# Patient Record
Sex: Female | Born: 1975 | Race: Black or African American | Hispanic: No | Marital: Single | Smoking: Former smoker
Health system: Southern US, Community
[De-identification: ages and names within clinical notes are randomized; demographics above are authoritative.]

## PROBLEM LIST (undated history)

## (undated) ENCOUNTER — Ambulatory Visit: Admission: EM | Payer: Medicaid Other | Source: Home / Self Care

## (undated) DIAGNOSIS — D497 Neoplasm of unspecified behavior of endocrine glands and other parts of nervous system: Secondary | ICD-10-CM

## (undated) HISTORY — PX: OTHER SURGICAL HISTORY: SHX169

## (undated) HISTORY — PX: ABDOMINAL HYSTERECTOMY: SHX81

---

## 2014-04-17 ENCOUNTER — Emergency Department (HOSPITAL_COMMUNITY)
Admission: EM | Admit: 2014-04-17 | Discharge: 2014-04-17 | Disposition: A | Discharge status: Home / Self Care | Payer: Medicaid Other | Attending: Emergency Medicine | Admitting: Emergency Medicine

## 2014-04-17 ENCOUNTER — Encounter (HOSPITAL_COMMUNITY): Payer: Self-pay | Admitting: Emergency Medicine

## 2014-04-17 DIAGNOSIS — Z880 Allergy status to penicillin: Secondary | ICD-10-CM | POA: Insufficient documentation

## 2014-04-17 DIAGNOSIS — K089 Disorder of teeth and supporting structures, unspecified: Secondary | ICD-10-CM | POA: Insufficient documentation

## 2014-04-17 DIAGNOSIS — K0889 Other specified disorders of teeth and supporting structures: Secondary | ICD-10-CM

## 2014-04-17 DIAGNOSIS — K029 Dental caries, unspecified: Secondary | ICD-10-CM | POA: Insufficient documentation

## 2014-04-17 DIAGNOSIS — Z79899 Other long term (current) drug therapy: Secondary | ICD-10-CM | POA: Insufficient documentation

## 2014-04-17 DIAGNOSIS — Z792 Long term (current) use of antibiotics: Secondary | ICD-10-CM | POA: Insufficient documentation

## 2014-04-17 MED ORDER — CLINDAMYCIN HCL 150 MG PO CAPS: 300.0000 mg | ORAL_CAPSULE | Freq: Three times a day (TID) | ORAL | Status: DC

## 2014-04-17 MED ORDER — LIDOCAINE VISCOUS 2 % MT SOLN: 20.0000 mL | OROMUCOSAL | Status: DC | PRN

## 2014-04-17 MED ORDER — ACIDOPHILUS PROBIOTIC 10 MG PO TABS: 1.0000 | ORAL_TABLET | Freq: Every day | ORAL | Status: DC

## 2014-04-17 NOTE — Discharge Instructions (Signed)
Please follow up with your primary care physician in 1-2 days. If you do not have one please call the [facility] number listed above. Please follow up with your dentist to discuss your visit to the ER as soon as possible. Please take your antibiotic until completion. Please alternate between Motrin and Tylenol every three hours for fevers and pain.  Please read all discharge instructions and return precautions.    Dental Pain A tooth ache may be caused by cavities (tooth decay). Cavities expose the nerve of the tooth to air and hot or cold temperatures. It may come from an infection or abscess (also called a boil or furuncle) around your tooth. It is also often caused by dental caries (tooth decay). This causes the pain you are having. DIAGNOSIS  Your caregiver can diagnose this problem by exam. TREATMENT   If caused by an infection, it may be treated with medications which kill germs (antibiotics) and pain medications as prescribed by your caregiver. Take medications as directed.  Only take over-the-counter or prescription medicines for pain, discomfort, or fever as directed by your caregiver.  Whether the tooth ache today is caused by infection or dental disease, you should see your dentist as soon as possible for further care. SEEK MEDICAL CARE IF: The exam and treatment you received today has been provided on an emergency basis only. This is not a substitute for complete medical or dental care. If your problem worsens or new problems (symptoms) appear, and you are unable to meet with your dentist, call or return to this location. SEEK IMMEDIATE MEDICAL CARE IF:   You have a fever.  You develop redness and swelling of your face, jaw, or neck.  You are unable to open your mouth.  You have severe pain uncontrolled by pain medicine. MAKE SURE YOU:   Understand these instructions.  Will watch your condition.  Will get help right away if you are not doing well or get  worse. Document Released: 11/24/2005 Document Revised: 02/16/2012 Document Reviewed: 07/12/2008 [facility] Patient Information 2014 [place].

## 2014-04-17 NOTE — ED Provider Notes (Signed)
Medical screening examination/treatment/procedure(s) were performed by non-physician practitioner and as supervising physician I was immediately available for consultation/collaboration.   EKG Interpretation None        Ezequiel Essex, MD 04/17/14 1736

## 2014-04-17 NOTE — ED Provider Notes (Signed)
CSN: [id number]     Arrival date & time 04/17/14  1256 History   First MD Initiated Contact with Patient 04/17/14 1306     Chief Complaint  Patient presents with  . Dental Pain     (Consider location/radiation/quality/duration/timing/severity/associated sxs/prior Treatment) HPI Comments: Patient is a 38 year old female presenting to the emergency department for mild upper gum discomfort with mild swelling x1 day. Patient feels like the whole right side of her face swelling. She states she has had something similar to this in the past and required antibiotics for a dental infection. Alleviating factors: none. Aggravating factors: none. No medications tried prior to arrival. Patient denies any fevers, chills, difficulty breathing, difficulty swallowing, sore throat, nasal congestion, rhinorrhea, chest pain, shortness of breath, nausea, vomiting. Patient does have a dentist.   Patient is a 38 y.o. female presenting with tooth pain.  Dental Pain Associated symptoms: facial swelling   Associated symptoms: no drooling and no fever     History reviewed. No pertinent past medical history. History reviewed. No pertinent past surgical history. No family history on file. History  Substance Use Topics  . Smoking status: Never Smoker   . Smokeless tobacco: Not on file  . Alcohol Use: Yes   OB History   Grav Para Term Preterm Abortions TAB SAB Ect Mult Living                 Review of Systems  Constitutional: Negative for fever and chills.  HENT: Positive for dental problem and facial swelling. Negative for drooling, ear pain, rhinorrhea, sinus pressure, sore throat, tinnitus and voice change.   Respiratory: Negative for shortness of breath.   Cardiovascular: Negative for chest pain.  All other systems reviewed and are negative.     Allergies  Penicillins  Home Medications   Prior to Admission medications   Medication Sig Start Date End Date Taking? Authorizing Provider   clindamycin (CLEOCIN) 150 MG capsule Take 2 capsules (300 mg total) by mouth 3 (three) times daily. May dispense as 150mg  capsules 04/17/14   Zyanne Schumm L Ziyon Cedotal, PA-C  Lactobacillus (ACIDOPHILUS PROBIOTIC) 10 MG TABS Take 1 tablet by mouth daily. 04/17/14   Macsen Nuttall L Derrika Ruffalo, PA-C  lidocaine (XYLOCAINE) 2 % solution Use as directed 20 mLs in the mouth or throat as needed for mouth pain. 04/17/14   Rasheida Broden L Briley Bumgarner, PA-C   BP 106/72  Pulse 84  Temp(Src) 99.8 F (37.7 C) (Oral)  Resp 18  SpO2 99% Physical Exam  Nursing note and vitals reviewed. Constitutional: She is oriented to person, place, and time. She appears well-developed and well-nourished. No distress.  HENT:  Head: Normocephalic and atraumatic.  Right Ear: External ear normal.  Left Ear: External ear normal.  Nose: Nose normal.  Mouth/Throat: Uvula is midline, oropharynx is clear and moist and mucous membranes are normal. No oral lesions. No trismus in the jaw. Abnormal dentition. Dental caries present. No dental abscesses or uvula swelling. No oropharyngeal exudate.  No gross dental abscess.  No facial swelling  Eyes: Conjunctivae are normal.  Neck: Normal range of motion. Neck supple.  Cardiovascular: Normal rate, regular rhythm and normal heart sounds.   Pulmonary/Chest: Effort normal and breath sounds normal. No stridor. No respiratory distress.  Musculoskeletal: Normal range of motion.  Lymphadenopathy:    She has no cervical adenopathy.  Neurological: She is alert and oriented to person, place, and time.  Skin: Skin is warm and dry. She is not diaphoretic.  Psychiatric: She has a normal  mood and affect.    ED Course  Procedures (including critical care time) Medications - No data to display  Labs Review Labs Reviewed - No data to display  Imaging Review No results found.   EKG Interpretation None      MDM   Final diagnoses:  Pain, dental    Filed Vitals:   04/17/14 1419  BP: 106/72   Pulse: 84  Temp:   Resp: 18   Afebrile, NAD, non-toxic appearing, AAOx4.   Patient with toothache.  No gross abscess.  Exam unconcerning for Ludwig's angina or spread of infection.  Will treat with clindamycin d/t PCN allergy and topical medication.  Urged patient to follow-up with dentist. Return precautions discussed. Patient is agreeable to plan. Patient is stable at time of discharge       Harlow Mares, PA-C 04/17/14 1630

## 2014-04-17 NOTE — ED Notes (Signed)
Thinks she has infection on her  Upper gum x 1 days making her lips and face swell

## 2015-10-17 ENCOUNTER — Encounter (HOSPITAL_COMMUNITY): Payer: Self-pay

## 2015-10-17 ENCOUNTER — Emergency Department (HOSPITAL_COMMUNITY)
Admission: EM | Admit: 2015-10-17 | Discharge: 2015-10-17 | Disposition: A | Discharge status: Home / Self Care | Payer: Medicaid Other | Attending: Emergency Medicine | Admitting: Emergency Medicine

## 2015-10-17 DIAGNOSIS — R630 Anorexia: Secondary | ICD-10-CM | POA: Insufficient documentation

## 2015-10-17 DIAGNOSIS — Z79899 Other long term (current) drug therapy: Secondary | ICD-10-CM | POA: Insufficient documentation

## 2015-10-17 DIAGNOSIS — R197 Diarrhea, unspecified: Secondary | ICD-10-CM | POA: Insufficient documentation

## 2015-10-17 DIAGNOSIS — J069 Acute upper respiratory infection, unspecified: Secondary | ICD-10-CM

## 2015-10-17 DIAGNOSIS — Z792 Long term (current) use of antibiotics: Secondary | ICD-10-CM | POA: Insufficient documentation

## 2015-10-17 DIAGNOSIS — Z880 Allergy status to penicillin: Secondary | ICD-10-CM | POA: Insufficient documentation

## 2015-10-17 DIAGNOSIS — R42 Dizziness and giddiness: Secondary | ICD-10-CM

## 2015-10-17 DIAGNOSIS — N390 Urinary tract infection, site not specified: Secondary | ICD-10-CM | POA: Insufficient documentation

## 2015-10-17 LAB — CBC WITH DIFFERENTIAL/PLATELET
Basophils Absolute: 0 10*3/uL (ref 0.0–0.1)
Basophils Relative: 0 %
Eosinophils Absolute: 0.1 10*3/uL (ref 0.0–0.7)
Eosinophils Relative: 1 %
HCT: 40 % (ref 36.0–46.0)
Hemoglobin: 13.4 g/dL (ref 12.0–15.0)
Lymphocytes Relative: 20 %
Lymphs Abs: 1.3 10*3/uL (ref 0.7–4.0)
MCH: 26.2 pg (ref 26.0–34.0)
MCHC: 33.5 g/dL (ref 30.0–36.0)
MCV: 78.1 fL (ref 78.0–100.0)
Monocytes Absolute: 1 10*3/uL (ref 0.1–1.0)
Monocytes Relative: 16 %
Neutro Abs: 4.2 10*3/uL (ref 1.7–7.7)
Neutrophils Relative %: 63 %
Platelets: 145 10*3/uL — ABNORMAL LOW (ref 150–400)
RBC: 5.12 MIL/uL — ABNORMAL HIGH (ref 3.87–5.11)
RDW: 12.3 % (ref 11.5–15.5)
WBC: 6.5 10*3/uL (ref 4.0–10.5)

## 2015-10-17 LAB — BASIC METABOLIC PANEL
Anion gap: 10 (ref 5–15)
BUN: 17 mg/dL (ref 6–20)
CO2: 21 mmol/L — ABNORMAL LOW (ref 22–32)
Calcium: 9.4 mg/dL (ref 8.9–10.3)
Chloride: 103 mmol/L (ref 101–111)
Creatinine, Ser: 0.99 mg/dL (ref 0.44–1.00)
GFR calc Af Amer: >60 mL/min (ref >60)
GFR calc non Af Amer: >60 mL/min (ref >60)
Glucose, Bld: 86 mg/dL (ref 65–99)
Potassium: 4.1 mmol/L (ref 3.5–5.1)
Sodium: 134 mmol/L — ABNORMAL LOW (ref 135–145)

## 2015-10-17 LAB — URINALYSIS, ROUTINE W REFLEX MICROSCOPIC
Glucose, UA: NEGATIVE mg/dL
Ketones, ur: 40 mg/dL — AB
Nitrite: NEGATIVE
Protein, ur: NEGATIVE mg/dL
Specific Gravity, Urine: 1.028 (ref 1.005–1.030)
Urobilinogen, UA: 0.2 mg/dL (ref 0.0–1.0)
pH: 5.5 (ref 5.0–8.0)

## 2015-10-17 LAB — URINE MICROSCOPIC-ADD ON

## 2015-10-17 LAB — RAPID STREP SCREEN (MED CTR MEBANE ONLY): Streptococcus, Group A Screen (Direct): NEGATIVE

## 2015-10-17 MED ORDER — PSEUDOEPHEDRINE HCL 60 MG PO TABS: 60.0000 mg | ORAL_TABLET | Freq: Four times a day (QID) | ORAL | Status: DC | PRN

## 2015-10-17 MED ORDER — SODIUM CHLORIDE 0.9 % IV BOLUS (SEPSIS)
1000.0000 mL | Freq: Once | INTRAVENOUS | Status: AC
  Administered 2015-10-17: 1000 mL via INTRAVENOUS

## 2015-10-17 MED ORDER — NITROFURANTOIN MONOHYD MACRO 100 MG PO CAPS: 100.0000 mg | ORAL_CAPSULE | Freq: Two times a day (BID) | ORAL | Status: DC

## 2015-10-17 NOTE — ED Provider Notes (Signed)
CSN: [id number]     Arrival date & time 10/17/15  1602 History  By signing my name below, I, Randa Evens, attest that this documentation has been prepared under the direction and in the presence of [employer], [place]. Electronically Signed: Randa Evens, ED Scribe. 10/17/2015. 9:21 PM.      Chief Complaint  Patient presents with  . Sore Throat   The history is provided by the patient. No language interpreter was used.   HPI Comments: Kristen Williamson is a 39 y.o. female who presents to the Emergency Department complaining of subjective fever onset 2 days prior. Pt also reports decreased appetite, fatigue, sore throat, chills, congestion, myalgias, nausea, diarrhea. Pt also reports some intermittent dizziness. Pt states that her sore throat is worse when swallowing. Pt describes her dizziness as it being hard to keep her balance when standing/ambulating and notes that it is worse when standing or walking. She states that the dizziness improves when sitting or laying down. Pt denies taking any medications for her symptoms. Pt denies HA, ear pain, trouble swallowing, drooling, cough, CP, abdominal pain, dysuria,hematuria, difficulty urinating, vaginal bleeding or vaginal discharge, blood in stool. Pt does report that 1 day last week she had urinary frequency.    History reviewed. No pertinent past medical history. Past Surgical History  Procedure Laterality Date  . Birth mark removed    . Abdominal hysterectomy     No family history on file. Social History  Substance Use Topics  . Smoking status: Never Smoker   . Smokeless tobacco: None  . Alcohol Use: Yes     Comment: occasional   OB History    No data available     Review of Systems  Constitutional: Positive for fever (subjective), chills, appetite change and fatigue.  HENT: Positive for congestion and sore throat. Negative for drooling, ear pain, rhinorrhea and trouble swallowing.   Respiratory: Negative for cough  and shortness of breath.   Cardiovascular: Negative for chest pain.  Gastrointestinal: Positive for nausea and diarrhea. Negative for vomiting and abdominal pain.  Genitourinary: Positive for frequency. Negative for dysuria, vaginal bleeding, vaginal discharge and difficulty urinating.  Neurological: Negative for headaches.  All other systems reviewed and are negative.     Allergies  Penicillins  Home Medications   Prior to Admission medications   Medication Sig Start Date End Date Taking? Authorizing Provider  clindamycin (CLEOCIN) 150 MG capsule Take 2 capsules (300 mg total) by mouth 3 (three) times daily. May dispense as 150mg  capsules 04/17/14   Jennifer Piepenbrink, PA-C  Lactobacillus (ACIDOPHILUS PROBIOTIC) 10 MG TABS Take 1 tablet by mouth daily. 04/17/14   Jennifer Piepenbrink, PA-C  lidocaine (XYLOCAINE) 2 % solution Use as directed 20 mLs in the mouth or throat as needed for mouth pain. 04/17/14   Jennifer Piepenbrink, PA-C  nitrofurantoin, macrocrystal-monohydrate, (MACROBID) 100 MG capsule Take 1 capsule (100 mg total) by mouth 2 (two) times daily. 10/17/15   Nona Dell, PA-C  pseudoephedrine (SUDAFED) 60 MG tablet Take 1 tablet (60 mg total) by mouth every 6 (six) hours as needed for congestion. 10/17/15   Chesley Noon Mir Fullilove, PA-C   BP 93/67 mmHg  Pulse 80  Temp(Src) 99 F (37.2 C) (Oral)  Resp 16  SpO2 100%   Physical Exam  Constitutional: She is oriented to person, place, and time. She appears well-developed and well-nourished. No distress.  HENT:  Head: Normocephalic and atraumatic.  Right Ear: Tympanic membrane, external ear and ear canal normal.  Left Ear: Tympanic membrane, external ear and ear canal normal.  Nose: Nose normal. Right sinus exhibits no maxillary sinus tenderness and no frontal sinus tenderness. Left sinus exhibits no maxillary sinus tenderness and no frontal sinus tenderness.  Mouth/Throat: Uvula is midline. Mucous membranes are  dry. Oropharyngeal exudate and posterior oropharyngeal erythema present. No posterior oropharyngeal edema or tonsillar abscesses.  Eyes: Conjunctivae and EOM are normal. Right eye exhibits no discharge. Left eye exhibits no discharge. No scleral icterus.  Neck: Normal range of motion. Neck supple. No tracheal deviation present.  Cardiovascular: Normal rate, regular rhythm and normal heart sounds.   No murmur heard. Pulmonary/Chest: Effort normal and breath sounds normal. No respiratory distress. She has no wheezes. She has no rales. She exhibits no tenderness.  Abdominal: Bowel sounds are normal. She exhibits no distension and no mass. There is no tenderness. There is no rebound and no guarding.  Musculoskeletal: Normal range of motion. She exhibits no edema.  Lymphadenopathy:    She has no cervical adenopathy.  Neurological: She is alert and oriented to person, place, and time.  Skin: Skin is warm and dry.  Psychiatric: She has a normal mood and affect. Her behavior is normal.  Nursing note and vitals reviewed.   ED Course  Procedures (including critical care time) DIAGNOSTIC STUDIES: Oxygen Saturation is 100% on RA, normal by my interpretation.    COORDINATION OF CARE: 6:26 PM-Discussed treatment plan with pt at bedside and pt agreed to plan.    Labs Review Labs Reviewed  CBC WITH DIFFERENTIAL/PLATELET - Abnormal; Notable for the following:    RBC 5.12 (*)    Platelets 145 (*)    All other components within normal limits  BASIC METABOLIC PANEL - Abnormal; Notable for the following:    Sodium 134 (*)    CO2 21 (*)    All other components within normal limits  URINALYSIS, ROUTINE W REFLEX MICROSCOPIC (NOT AT [facility]) - Abnormal; Notable for the following:    Color, Urine AMBER (*)    Hgb urine dipstick TRACE (*)    Bilirubin Urine MODERATE (*)    Ketones, ur 40 (*)    Leukocytes, UA SMALL (*)    All other components within normal limits  URINE MICROSCOPIC-ADD ON - Abnormal;  Notable for the following:    Bacteria, UA FEW (*)    Casts HYALINE CASTS (*)    All other components within normal limits  RAPID STREP SCREEN (NOT AT [facility])  CULTURE, GROUP A STREP    Imaging Review No results found.     MDM   Final diagnoses:  Viral URI  Lightheadedness  UTI (lower urinary tract infection)   Patient presents with subjective fever, chills, body aches, sore throat, nausea, diarrhea, dizziness. She also reports having urinary frequency. She notes her nausea has since resolved. VSS. Exam revealed dry mucous membranes, oropharyngeal erythema and bilateral white tonsillar exudate. No neuro deficits. No trismus, drooling, neck swelling or stridor on exam. Patient given IV fluids. UA consistent with UTI. Rapid strep negative, score of 2 on Modified [facility] Criteria. I suspect patient's symptoms are likely due to viral URI. Labs unremarkable.  On reexam, patient was sitting in her room eating a salad that her family had brought her. She notes she is feeling better and has not felt nauseous or vomited while in the ED. After IV fluids given patient able to stand and ambulate in the room without any reported dizziness. I suspect her dizziness is likely due to  to dehydration. Plan to discharge patient home with antibiotic for UTI and decongestion for URI. Advised patient to continue drinking fluids to remain hydrated. Patient given resource guide to follow up with PCP.  Evaluation does not show pathology requring ongoing emergent intervention or admission. Pt is hemodynamically stable and mentating appropriately. Discussed findings/results and plan with patient/guardian, who agrees with plan. All questions answered. Return precautions discussed and outpatient follow up given.    I personally performed the services described in this documentation, which was scribed in my presence. The recorded information has been reviewed and is accurate.      Chesley Noon [place],  [place] 10/17/15 2126  Jola Schmidt, MD 10/18/15 [id number]

## 2015-10-17 NOTE — ED Notes (Signed)
IV attempted x's 2 without success 

## 2015-10-17 NOTE — ED Notes (Signed)
Pt. Reports having decreased appetite, chills, fever, body aches, sore throat nausea and loose stool.   Pt. Is also having dizziness.

## 2015-10-17 NOTE — Discharge Instructions (Signed)
Take your medications as prescribed. Continue drinking fluids to remain hydrated. Please follow up with a primary care provider from the Resource Guide provided below in 1 week. Please return to the Emergency Department if symptoms worsen or new onset of fever, numbness, tingling, weakness, syncope, difficulty breathing, chest pain.    Emergency Department Resource Guide 1) Find a Doctor and Pay Out of Pocket Although you won't have to find out who is covered by your insurance plan, it is a good idea to ask around and get recommendations. You will then need to call the office and see if the doctor you have chosen will accept you as a new patient and what types of options they offer for patients who are self-pay. Some doctors offer discounts or will set up payment plans for their patients who do not have insurance, but you will need to ask so you aren't surprised when you get to your appointment.  2) Contact Your Local Health Department Not all health departments have doctors that can see patients for sick visits, but many do, so it is worth a call to see if yours does. If you don't know where your local health department is, you can check in your phone book. The CDC also has a tool to help you locate your state's health department, and many state websites also have listings of all of their local health departments.  3) Find a [facility] If your illness is not likely to be very severe or complicated, you may want to try a walk in clinic. These are popping up all over the country in pharmacies, drugstores, and shopping centers. They're usually staffed by nurse practitioners or physician assistants that have been trained to treat common illnesses and complaints. They're usually fairly quick and inexpensive. However, if you have serious medical issues or chronic medical problems, these are probably not your best option.  No Primary Care Doctor: - Call Health Connect at  [phone] - they can help you  locate a primary care doctor that  accepts your insurance, provides certain services, etc. - Physician Referral Service- [phone]  Chronic Pain Problems: Organization         Address  Phone   Notes  [facility]  [phone] Patients need to be referred by their primary care doctor.   Medication Assistance: Organization         Address  Phone   Notes  [facility] Medication [facility] [address] [place], [place] [address] [phone] --Must be a resident of [facility] -- Must have NO insurance coverage whatsoever (no Medicaid/ Medicare, etc.) -- The pt. MUST have a primary care doctor that directs their care regularly and follows them in the community   MedAssist  [phone]   [employer]  [phone]    Agencies that provide inexpensive medical care: Organization         Address  Phone   Notes  [place]  [phone]   [facility]    [phone]   [facility] [place], [place] [address] [phone]   [place] [address] [place] [phone]   Planned Parenthood    [phone]   [facility]    [phone]   [place] and [place] [address], [place] Phone:  [phone], Fax:  [phone] Hours of Operation:  9 am - 6 pm, M-F.  Also accepts Medicaid/Medicare and self-pay.  [facility] [place] [address], [address], [place] Phone: [phone], Fax: [phone]. Hours of Operation:  8:30 am - 5:30 pm, M-F.  Also accepts Medicaid and self-pay.  [facility] [place] [address] [place] Phone: [phone]   [place], [place], [place] [phone], Ext. [phone] Mondays & Thursdays: 7-9 AM.  First 15 patients are seen on a first come, first serve basis.    [place]  Providers:  Organization         Address  Phone   Notes  [facility] [address], [place] [phone] Also accepts self-pay patients.  [facility] [address], [place]  [phone]   [place], [address], [place] [phone]   [facility] [address] [place] [phone]   Lucianne Lei [address], [place]   [phone] Only accepts [place] Access [place] patients after they have their name applied to their card.   Self-Pay (no insurance) in [facility]:  Organization         Address  Phone   Notes  Sickle Cell Patients, [facility] [place] [phone]   [facility] [place] [phone]   [facility] [place]  [place], [place],  [phone]   [facility]/Dr. Osei-Bonsu  [address] [place] or [address], [place] [phone] Phone number for both [place] and [place] locations is the same.  [facility] [address], [place] [phone]   [facility] [address] [place] or [address] [phone]   [facility] [address], [place] [phone], phone; [phone], fax Sees patients 1st and 3rd Saturday of every month.  Must not qualify for public or private insurance (i.e. Medicaid, Medicare, [place] Health Choice, Veterans' Benefits)  Household income should be no more than 200% of the poverty level The clinic cannot treat you if you are pregnant or think you are pregnant  Sexually transmitted diseases are not treated at the clinic.    Dental Care: Organization         Address  Phone  Notes  [facility] Department of [facility] [place] [phone] Accepts children up to age 12 who are enrolled in [place] or [place]; pregnant women with a Medicaid card; and children who have applied for Medicaid or [place] Health Choice, but were declined, whose parents can pay a reduced fee at time of service.  [facility] Department of [facility]  [address], [place] [phone] Accepts children up to age 24 who are enrolled in [place] or [place]; pregnant women with a Medicaid card; and children who have applied for Medicaid or [place] Health Choice, but were declined, whose parents can pay a reduced fee at time of service.  [place] Adult [facility] Access PROGRAM  [place] [phone] Patients are seen by appointment only. Walk-ins are not accepted. [place] will see patients 18 years  of age and older. Monday - Tuesday (8am-5pm) Most Wednesdays (8:30-5pm) $30 per visit, cash only  [facility] Adult Dental Access PROGRAM  [address], [facility] [phone] Patients are seen by appointment only. Walk-ins are not accepted. [place] will see patients 49 years of age and older. One Wednesday Evening (Monthly: Volunteer Based).  $30 per visit, cash only  [place]  [phone] for adults; Children under age 71, call Graduate Pediatric Dentistry at [phone]. Children aged 51-14, please call [phone] to request a pediatric application.  Dental services are provided in all areas of dental care including fillings, crowns and bridges, complete and partial dentures, implants, gum treatment, root canals, and extractions. Preventive care is also provided. Treatment is provided to both adults and children. Patients are selected via a lottery and there is often a waiting list.   [facility] [address], [place]  [phone] [email or web address]   [facility]  [address] [place], [place] [phone], Ext. [phone] Second and Fourth Thursday of each month, opens at 6:30 AM; Clinic ends at 9 AM.  Patients are seen on a first-come first-served basis, and a limited number are seen during each clinic.   [facility]  [address] Hillard Danker [place], [place] [phone]   Eligibility Requirements You must have lived in [place], [place], or [place] counties for at least the last three months.   You cannot be eligible for state or federal sponsored [employer], including [employer], [place], or [employer].   You generally cannot be eligible for healthcare insurance through your employer.    How to apply: Eligibility screenings are held every Tuesday and Wednesday afternoon from 1:00 pm until 4:00 pm. You do not need an appointment for the interview!  [facility] [address] [place], [place]   [place]  [place] Department  [place]  [phone]    Behavioral Health Resources in the Community: Intensive Outpatient Programs Organization         Address  Phone  Notes  [place]. [address] [place], [place] [phone]   [facility] [address] [place], [place]   ADS: [facility] & Drug S[facility] [address], [place], [place]   [place] [address]  [place], [place] or [phone]   Substance Abuse Resources Organization         Address  Phone  Notes  Alcohol and Drug Services  [phone]   [place]  [phone]   [facility] [place]   Chinita Pester  [phone]   Residential & Outpatient Substance Abuse Program  [phone]   Psychological Services Organization         Address  Phone  Notes  [facility] [place]  [place]  [phone]   [place] [address], [place] or [phone]    Mobile Crisis Teams Organization         Address  Phone  Notes  [facility], Mobile Crisis Care Unit  [phone]   [facility]ive Psychotherapeutic Services  [address] [place], [place]   [facility] [address] [place] [phone]    Self-Help/Support Groups Organization  Address  Phone             Notes  [place] of [place] - variety of support groups  [place] Call for more information  Narcotics Anonymous (NA), [facility] [address], [employer] [place]  2 meetings at this location   Special educational needs teacher         Address  Phone  Notes  ASAP Residential Treatment [place],    [place]  [phone]   [facility]  [address] [place] [address], [place], [place]   [place], [place] [phone] Admissions: 8am-3pm M-F  Incentives Substance [place] [address],    [place], [place] [phone]   [facility] [address] [place], [place], [place]   [facility] [address]  [place], [place]   [facility] - Intensive Outpatient [address], [place], [place]   [facility] ([place].,  [place], [place] [phone] or [phone]   [facility] Services (RTS) [address], [place], [place] Accepts Medicaid  [facility] [place] [address]  [place] [phone] Substance Abuse/Addiction Treatment   [facility] Organization         Address  Phone  Notes  [facility]  [phone]   Domenic Schwab, PhD [address] [place], [place]   [phone] or [phone]    [place], [place] [phone]   [facility] [address], [place], [place] [phone] Insurance/Medicaid/sponsorship through [facility] and Families [address] [place]                                    [place], [place] [phone] [place] [address], [place] [phone]    Dr. Adele Schilder  [phone]   [facility] Dept. 1) [address] [place] 2) [place] 3)  [place] [address], [place] [phone]  [phone]   [place] [phone] or [phone] (After Hours)

## 2015-10-19 LAB — CULTURE, GROUP A STREP

## 2015-12-31 ENCOUNTER — Other Ambulatory Visit (HOSPITAL_COMMUNITY): Admit: 2015-12-31 | Payer: Medicaid Other

## 2020-02-16 ENCOUNTER — Emergency Department (HOSPITAL_COMMUNITY)
Admission: EM | Admit: 2020-02-16 | Discharge: 2020-02-17 | Discharge status: Against Medical Advice | Payer: Medicaid Other | Attending: Emergency Medicine | Admitting: Emergency Medicine

## 2020-02-16 ENCOUNTER — Encounter (HOSPITAL_COMMUNITY): Payer: Self-pay

## 2020-02-16 ENCOUNTER — Other Ambulatory Visit: Payer: Self-pay

## 2020-02-16 DIAGNOSIS — R590 Localized enlarged lymph nodes: Secondary | ICD-10-CM | POA: Insufficient documentation

## 2020-02-16 DIAGNOSIS — J029 Acute pharyngitis, unspecified: Secondary | ICD-10-CM

## 2020-02-16 DIAGNOSIS — Z79899 Other long term (current) drug therapy: Secondary | ICD-10-CM | POA: Insufficient documentation

## 2020-02-16 LAB — GROUP A STREP BY PCR: Group A Strep by PCR: NOT DETECTED

## 2020-02-16 MED ORDER — ACETAMINOPHEN 325 MG PO TABS
650.0000 mg | ORAL_TABLET | Freq: Once | ORAL | Status: AC | PRN
  Administered 2020-02-16: 650 mg via ORAL
  Filled 2020-02-16: qty 2

## 2020-02-16 NOTE — ED Triage Notes (Signed)
Pt arrives via pov w/ c/o "throat irritation", pt denies pain, itchiness, or feeling of something stuck in her throat, states it is "just irritating".

## 2020-02-16 NOTE — ED Notes (Signed)
No answer for room 

## 2020-02-17 NOTE — ED Provider Notes (Signed)
[facility] EMERGENCY DEPARTMENT Provider Note   CSN: [id number] Arrival date & time: 02/16/20  2052     History Chief Complaint  Patient presents with  . Sore Throat    Kristen Williamson is a 44 y.o. female.  The history is provided by the patient. No language interpreter was used.  Sore Throat This is a new problem. The current episode started 2 days ago. The problem occurs constantly. The problem has not changed since onset.Pertinent negatives include no chest pain, no abdominal pain, no headaches and no shortness of breath. The symptoms are aggravated by drinking. Nothing relieves the symptoms. She has tried nothing for the symptoms.       History reviewed. No pertinent past medical history.  There are no problems to display for this patient.   Past Surgical History:  Procedure Laterality Date  . ABDOMINAL HYSTERECTOMY    . birth mark removed       OB History   No obstetric history on file.     History reviewed. No pertinent family history.  Social History   Tobacco Use  . Smoking status: Never Smoker  . Smokeless tobacco: Never Used  Substance Use Topics  . Alcohol use: Yes    Comment: occasional  . Drug use: No    Home Medications Prior to Admission medications   Medication Sig Start Date End Date Taking? Authorizing Provider  clindamycin (CLEOCIN) 150 MG capsule Take 2 capsules (300 mg total) by mouth 3 (three) times daily. May dispense as 150mg  capsules 04/17/14   Piepenbrink, Anderson [place], PA-C  Lactobacillus (ACIDOPHILUS PROBIOTIC) 10 MG TABS Take 1 tablet by mouth daily. 04/17/14   Piepenbrink, Anderson [place], PA-C  lidocaine (XYLOCAINE) 2 % solution Use as directed 20 mLs in the mouth or throat as needed for mouth pain. 04/17/14   Piepenbrink, Anderson [place], PA-C  nitrofurantoin, macrocrystal-monohydrate, (MACROBID) 100 MG capsule Take 1 capsule (100 mg total) by mouth 2 (two) times daily. 10/17/15   Nona Dell, PA-C  pseudoephedrine  (SUDAFED) 60 MG tablet Take 1 tablet (60 mg total) by mouth every 6 (six) hours as needed for congestion. 10/17/15   Nona Dell, PA-C    Allergies    Penicillins  Review of Systems   Review of Systems  Constitutional: Positive for fever. Negative for chills.  Respiratory: Negative for shortness of breath.   Cardiovascular: Negative for chest pain.  Gastrointestinal: Negative for abdominal pain.  Neurological: Negative for headaches.    Physical Exam Updated Vital Signs BP 126/87 (BP Location: Right Arm)   Pulse 88   Temp (!) 101.3 F (38.5 C) (Oral)   Resp 18   SpO2 100%   Physical Exam Vitals and nursing note reviewed.  Constitutional:      General: She is not in acute distress.    Appearance: She is well-developed.  HENT:     Head: Normocephalic and atraumatic.     Right Ear: Tympanic membrane normal.     Left Ear: Tympanic membrane normal.     Nose: No congestion or rhinorrhea.     Mouth/Throat:     Mouth: Mucous membranes are moist. No oral lesions.     Pharynx: Posterior oropharyngeal erythema present. No pharyngeal swelling, oropharyngeal exudate or uvula swelling.     Comments: Normal phonation, no stridor, no PTA Eyes:     Conjunctiva/sclera: Conjunctivae normal.  Cardiovascular:     Rate and Rhythm: Normal rate.     Heart sounds: No murmur.  Pulmonary:  Effort: Pulmonary effort is normal. No respiratory distress.  Abdominal:     General: There is no distension.  Musculoskeletal:     Cervical back: Neck supple.     Comments: Moves all extremities  Skin:    General: Skin is warm and dry.  Neurological:     Mental Status: She is alert and oriented to person, place, and time.  Psychiatric:        Mood and Affect: Mood normal.        Behavior: Behavior normal.     ED Results / Procedures / Treatments   Labs (all labs ordered are listed, but only abnormal results are displayed) Labs Reviewed  GROUP A STREP BY PCR    EKG None   Radiology No results found.  Procedures Procedures (including critical care time)  Medications Ordered in ED Medications  acetaminophen (TYLENOL) tablet 650 mg (650 mg Oral Given 02/16/20 2120)    ED Course  I have reviewed the triage vital signs and the nursing notes.  Pertinent labs & imaging results that were available during my care of the patient were reviewed by me and considered in my medical decision making (see chart for details).    MDM Rules/Calculators/A&P                      Pt afebrile without tonsillar exudate, negative strep. Presents with mild cervical lymphadenopathy, & dysphagia; likely viral pharyngitis. No abx indicated. DC w symptomatic tx for pain  Pt does not appear dehydrated, but did discuss importance of water rehydration. Presentation non concerning for PTA or infxn spread to soft tissue.  No trismus or uvula deviation. No cough or covid exposures.  Mono thought to be less likely. Specific return precautions discussed. Pt able to drink water in ED without difficulty with intact air way. Recommended PCP follow up.  Final Clinical Impression(s) / ED Diagnoses Final diagnoses:  Sore throat    Rx / DC Orders ED Discharge Orders    None       Montine Circle, PA-C 02/17/20 0006    Palumbo, April, MD 02/17/20 0007

## 2020-03-28 ENCOUNTER — Emergency Department (HOSPITAL_COMMUNITY): Payer: Medicaid Other

## 2020-03-28 ENCOUNTER — Encounter (HOSPITAL_COMMUNITY): Payer: Self-pay

## 2020-03-28 ENCOUNTER — Other Ambulatory Visit: Payer: Self-pay

## 2020-03-28 ENCOUNTER — Inpatient Hospital Stay (HOSPITAL_COMMUNITY)
Admission: EM | Admit: 2020-03-28 | Discharge: 2020-04-02 | DRG: 178 | Disposition: A | Discharge status: Home / Self Care | Payer: Medicaid Other | Attending: Internal Medicine | Admitting: Internal Medicine

## 2020-03-28 DIAGNOSIS — U071 COVID-19: Principal | ICD-10-CM | POA: Diagnosis present

## 2020-03-28 DIAGNOSIS — A0839 Other viral enteritis: Secondary | ICD-10-CM | POA: Diagnosis present

## 2020-03-28 DIAGNOSIS — D497 Neoplasm of unspecified behavior of endocrine glands and other parts of nervous system: Secondary | ICD-10-CM | POA: Diagnosis present

## 2020-03-28 DIAGNOSIS — E876 Hypokalemia: Secondary | ICD-10-CM | POA: Diagnosis present

## 2020-03-28 DIAGNOSIS — Z880 Allergy status to penicillin: Secondary | ICD-10-CM | POA: Diagnosis not present

## 2020-03-28 DIAGNOSIS — I959 Hypotension, unspecified: Secondary | ICD-10-CM | POA: Diagnosis not present

## 2020-03-28 DIAGNOSIS — E2740 Unspecified adrenocortical insufficiency: Secondary | ICD-10-CM | POA: Diagnosis present

## 2020-03-28 DIAGNOSIS — Z79899 Other long term (current) drug therapy: Secondary | ICD-10-CM

## 2020-03-28 DIAGNOSIS — R17 Unspecified jaundice: Secondary | ICD-10-CM | POA: Diagnosis present

## 2020-03-28 DIAGNOSIS — A4189 Other specified sepsis: Secondary | ICD-10-CM | POA: Diagnosis not present

## 2020-03-28 DIAGNOSIS — N179 Acute kidney failure, unspecified: Secondary | ICD-10-CM | POA: Diagnosis present

## 2020-03-28 DIAGNOSIS — E872 Acidosis: Secondary | ICD-10-CM | POA: Diagnosis present

## 2020-03-28 DIAGNOSIS — R001 Bradycardia, unspecified: Secondary | ICD-10-CM | POA: Diagnosis present

## 2020-03-28 DIAGNOSIS — R9431 Abnormal electrocardiogram [ECG] [EKG]: Secondary | ICD-10-CM | POA: Diagnosis present

## 2020-03-28 DIAGNOSIS — I9589 Other hypotension: Secondary | ICD-10-CM | POA: Diagnosis not present

## 2020-03-28 DIAGNOSIS — R739 Hyperglycemia, unspecified: Secondary | ICD-10-CM | POA: Diagnosis present

## 2020-03-28 DIAGNOSIS — E860 Dehydration: Secondary | ICD-10-CM | POA: Diagnosis present

## 2020-03-28 DIAGNOSIS — R7401 Elevation of levels of liver transaminase levels: Secondary | ICD-10-CM | POA: Diagnosis present

## 2020-03-28 DIAGNOSIS — Z90710 Acquired absence of both cervix and uterus: Secondary | ICD-10-CM | POA: Diagnosis not present

## 2020-03-28 DIAGNOSIS — K529 Noninfective gastroenteritis and colitis, unspecified: Secondary | ICD-10-CM | POA: Diagnosis present

## 2020-03-28 DIAGNOSIS — R579 Shock, unspecified: Secondary | ICD-10-CM | POA: Diagnosis present

## 2020-03-28 DIAGNOSIS — E861 Hypovolemia: Secondary | ICD-10-CM | POA: Diagnosis present

## 2020-03-28 DIAGNOSIS — R000 Tachycardia, unspecified: Secondary | ICD-10-CM | POA: Diagnosis present

## 2020-03-28 DIAGNOSIS — E871 Hypo-osmolality and hyponatremia: Secondary | ICD-10-CM | POA: Diagnosis present

## 2020-03-28 HISTORY — DX: Neoplasm of unspecified behavior of endocrine glands and other parts of nervous system: D49.7

## 2020-03-28 LAB — CBC WITH DIFFERENTIAL/PLATELET
Abs Immature Granulocytes: 0.02 10*3/uL (ref 0.00–0.07)
Basophils Absolute: 0 10*3/uL (ref 0.0–0.1)
Basophils Relative: 0 %
Eosinophils Absolute: 0 10*3/uL (ref 0.0–0.5)
Eosinophils Relative: 0 %
HCT: 44.4 % (ref 36.0–46.0)
Hemoglobin: 13.9 g/dL (ref 12.0–15.0)
Immature Granulocytes: 0 %
Lymphocytes Relative: 34 %
Lymphs Abs: 2.3 10*3/uL (ref 0.7–4.0)
MCH: 26.2 pg (ref 26.0–34.0)
MCHC: 31.3 g/dL (ref 30.0–36.0)
MCV: 83.8 fL (ref 80.0–100.0)
Monocytes Absolute: 0.8 10*3/uL (ref 0.1–1.0)
Monocytes Relative: 12 %
Neutro Abs: 3.7 10*3/uL (ref 1.7–7.7)
Neutrophils Relative %: 54 %
Platelets: 173 10*3/uL (ref 150–400)
RBC: 5.3 MIL/uL — ABNORMAL HIGH (ref 3.87–5.11)
RDW: 12.1 % (ref 11.5–15.5)
WBC: 6.8 10*3/uL (ref 4.0–10.5)
nRBC: 0 % (ref 0.0–0.2)

## 2020-03-28 LAB — COMPREHENSIVE METABOLIC PANEL
ALT: 52 U/L — ABNORMAL HIGH (ref 0–44)
AST: 84 U/L — ABNORMAL HIGH (ref 15–41)
Albumin: 4.2 g/dL (ref 3.5–5.0)
Alkaline Phosphatase: 83 U/L (ref 38–126)
Anion gap: 16 — ABNORMAL HIGH (ref 5–15)
BUN: 23 mg/dL — ABNORMAL HIGH (ref 6–20)
CO2: 15 mmol/L — ABNORMAL LOW (ref 22–32)
Calcium: 9 mg/dL (ref 8.9–10.3)
Chloride: 102 mmol/L (ref 98–111)
Creatinine, Ser: 1.87 mg/dL — ABNORMAL HIGH (ref 0.44–1.00)
GFR calc Af Amer: 37 mL/min — ABNORMAL LOW (ref >60)
GFR calc non Af Amer: 32 mL/min — ABNORMAL LOW (ref >60)
Glucose, Bld: 108 mg/dL — ABNORMAL HIGH (ref 70–99)
Potassium: 3.4 mmol/L — ABNORMAL LOW (ref 3.5–5.1)
Sodium: 133 mmol/L — ABNORMAL LOW (ref 135–145)
Total Bilirubin: 1.5 mg/dL — ABNORMAL HIGH (ref 0.3–1.2)
Total Protein: 7.6 g/dL (ref 6.5–8.1)

## 2020-03-28 LAB — BASIC METABOLIC PANEL
Anion gap: 10 (ref 5–15)
BUN: 16 mg/dL (ref 6–20)
CO2: 21 mmol/L — ABNORMAL LOW (ref 22–32)
Calcium: 8.8 mg/dL — ABNORMAL LOW (ref 8.9–10.3)
Chloride: 105 mmol/L (ref 98–111)
Creatinine, Ser: 1.16 mg/dL — ABNORMAL HIGH (ref 0.44–1.00)
GFR calc Af Amer: >60 mL/min (ref >60)
GFR calc non Af Amer: 57 mL/min — ABNORMAL LOW (ref >60)
Glucose, Bld: 109 mg/dL — ABNORMAL HIGH (ref 70–99)
Potassium: 3.9 mmol/L (ref 3.5–5.1)
Sodium: 136 mmol/L (ref 135–145)

## 2020-03-28 LAB — RESPIRATORY PANEL BY RT PCR (FLU A&B, COVID)
Influenza A by PCR: NEGATIVE
Influenza B by PCR: NEGATIVE
SARS Coronavirus 2 by RT PCR: POSITIVE — AB

## 2020-03-28 LAB — URINALYSIS, ROUTINE W REFLEX MICROSCOPIC
Bilirubin Urine: NEGATIVE
Glucose, UA: NEGATIVE mg/dL
Hgb urine dipstick: NEGATIVE
Ketones, ur: 5 mg/dL — AB
Leukocytes,Ua: NEGATIVE
Nitrite: NEGATIVE
Protein, ur: 30 mg/dL — AB
Specific Gravity, Urine: 1.02 (ref 1.005–1.030)
pH: 5 (ref 5.0–8.0)

## 2020-03-28 LAB — GLUCOSE, CAPILLARY
Glucose-Capillary: 81 mg/dL (ref 70–99)
Glucose-Capillary: 100 mg/dL — ABNORMAL HIGH (ref 70–99)

## 2020-03-28 LAB — PROTIME-INR
INR: 1.1 (ref 0.8–1.2)
Prothrombin Time: 14.4 seconds (ref 11.4–15.2)

## 2020-03-28 LAB — I-STAT BETA HCG BLOOD, ED (MC, WL, AP ONLY): I-stat hCG, quantitative: <5 m[IU]/mL (ref <5)

## 2020-03-28 LAB — CBG MONITORING, ED: Glucose-Capillary: 84 mg/dL (ref 70–99)

## 2020-03-28 LAB — MRSA PCR SCREENING: MRSA by PCR: NEGATIVE

## 2020-03-28 LAB — HIV ANTIBODY (ROUTINE TESTING W REFLEX): HIV Screen 4th Generation wRfx: NONREACTIVE

## 2020-03-28 LAB — TSH: TSH: 1.482 u[IU]/mL (ref 0.350–4.500)

## 2020-03-28 LAB — PHOSPHORUS: Phosphorus: 4 mg/dL (ref 2.5–4.6)

## 2020-03-28 LAB — HEMOGLOBIN A1C
Hgb A1c MFr Bld: 5.6 % (ref 4.8–5.6)
Mean Plasma Glucose: 114.02 mg/dL

## 2020-03-28 LAB — T4, FREE: Free T4: 0.42 ng/dL — ABNORMAL LOW (ref 0.61–1.12)

## 2020-03-28 LAB — APTT: aPTT: 46 seconds — ABNORMAL HIGH (ref 24–36)

## 2020-03-28 LAB — MAGNESIUM: Magnesium: 1.8 mg/dL (ref 1.7–2.4)

## 2020-03-28 LAB — CORTISOL: Cortisol, Plasma: 7.3 ug/dL

## 2020-03-28 LAB — LACTIC ACID, PLASMA: Lactic Acid, Venous: 1.6 mmol/L (ref 0.5–1.9)

## 2020-03-28 MED ORDER — SODIUM CHLORIDE 0.9 % IV SOLN
200.0000 mg | Freq: Once | INTRAVENOUS | Status: AC
  Administered 2020-03-28: 200 mg via INTRAVENOUS
  Filled 2020-03-28: qty 40

## 2020-03-28 MED ORDER — SODIUM CHLORIDE 0.9 % IV SOLN
250.0000 mL | INTRAVENOUS | Status: DC
  Administered 2020-03-28: 17:00:00 250 mL via INTRAVENOUS

## 2020-03-28 MED ORDER — ENOXAPARIN SODIUM 40 MG/0.4ML ~~LOC~~ SOLN
40.0000 mg | SUBCUTANEOUS | Status: DC
  Administered 2020-03-28: 40 mg via SUBCUTANEOUS
  Filled 2020-03-28: qty 0.4

## 2020-03-28 MED ORDER — SODIUM CHLORIDE 0.9 % IV SOLN
1.0000 g | Freq: Three times a day (TID) | INTRAVENOUS | Status: DC
  Administered 2020-03-28 – 2020-03-29 (×2): 1 g via INTRAVENOUS
  Filled 2020-03-28 (×4): qty 1

## 2020-03-28 MED ORDER — LACTATED RINGERS IV BOLUS
1000.0000 mL | Freq: Once | INTRAVENOUS | Status: AC
  Administered 2020-03-28: 1000 mL via INTRAVENOUS

## 2020-03-28 MED ORDER — NOREPINEPHRINE 4 MG/250ML-% IV SOLN
2.0000 ug/min | INTRAVENOUS | Status: DC
  Administered 2020-03-28: 17:00:00 2 ug/min via INTRAVENOUS
  Filled 2020-03-28: qty 250

## 2020-03-28 MED ORDER — HYDROCORTISONE NA SUCCINATE PF 100 MG IJ SOLR
100.0000 mg | Freq: Three times a day (TID) | INTRAMUSCULAR | Status: DC
  Administered 2020-03-28 – 2020-03-29 (×3): 100 mg via INTRAVENOUS
  Filled 2020-03-28 (×3): qty 2

## 2020-03-28 MED ORDER — FAMOTIDINE 20 MG PO TABS
20.0000 mg | ORAL_TABLET | Freq: Every day | ORAL | Status: DC
  Administered 2020-03-28 – 2020-04-01 (×5): 20 mg via ORAL
  Filled 2020-03-28 (×5): qty 1

## 2020-03-28 MED ORDER — METRONIDAZOLE IN NACL 5-0.79 MG/ML-% IV SOLN
500.0000 mg | Freq: Once | INTRAVENOUS | Status: AC
  Administered 2020-03-28: 12:00:00 500 mg via INTRAVENOUS
  Filled 2020-03-28: qty 100

## 2020-03-28 MED ORDER — ACETAMINOPHEN 500 MG PO TABS
1000.0000 mg | ORAL_TABLET | Freq: Once | ORAL | Status: AC
  Administered 2020-03-28: 1000 mg via ORAL
  Filled 2020-03-28: qty 2

## 2020-03-28 MED ORDER — VANCOMYCIN HCL 1750 MG/350ML IV SOLN: 1750.0000 mg | INTRAVENOUS | Status: DC

## 2020-03-28 MED ORDER — POLYETHYLENE GLYCOL 3350 17 G PO PACK: 17.0000 g | PACK | Freq: Every day | ORAL | Status: DC | PRN

## 2020-03-28 MED ORDER — LACTATED RINGERS IV BOLUS (SEPSIS)
1000.0000 mL | Freq: Once | INTRAVENOUS | Status: AC
  Administered 2020-03-28: 12:00:00 1000 mL via INTRAVENOUS

## 2020-03-28 MED ORDER — SODIUM CHLORIDE 0.9 % IV SOLN
100.0000 mg | Freq: Every day | INTRAVENOUS | Status: AC
  Administered 2020-03-29 – 2020-04-01 (×4): 100 mg via INTRAVENOUS
  Filled 2020-03-28 (×4): qty 20

## 2020-03-28 MED ORDER — LACTATED RINGERS IV BOLUS (SEPSIS)
250.0000 mL | Freq: Once | INTRAVENOUS | Status: AC
  Administered 2020-03-28: 250 mL via INTRAVENOUS

## 2020-03-28 MED ORDER — SODIUM CHLORIDE 0.9 % IV SOLN
2.0000 g | Freq: Once | INTRAVENOUS | Status: AC
  Administered 2020-03-28: 12:00:00 2 g via INTRAVENOUS
  Filled 2020-03-28: qty 2

## 2020-03-28 MED ORDER — DOCUSATE SODIUM 100 MG PO CAPS: 100.0000 mg | ORAL_CAPSULE | Freq: Two times a day (BID) | ORAL | Status: DC | PRN

## 2020-03-28 MED ORDER — ACETAMINOPHEN 500 MG PO TABS: 500.0000 mg | ORAL_TABLET | Freq: Four times a day (QID) | ORAL | Status: DC | PRN

## 2020-03-28 MED ORDER — POTASSIUM CHLORIDE CRYS ER 20 MEQ PO TBCR
40.0000 meq | EXTENDED_RELEASE_TABLET | Freq: Once | ORAL | Status: AC
  Administered 2020-03-28: 40 meq via ORAL
  Filled 2020-03-28: qty 2

## 2020-03-28 MED ORDER — CHLORHEXIDINE GLUCONATE CLOTH 2 % EX PADS
6.0000 | MEDICATED_PAD | Freq: Every day | CUTANEOUS | Status: DC
  Administered 2020-03-29 – 2020-03-31 (×2): 6 via TOPICAL

## 2020-03-28 MED ORDER — ONDANSETRON HCL 4 MG/2ML IJ SOLN
4.0000 mg | Freq: Four times a day (QID) | INTRAMUSCULAR | Status: DC | PRN
  Administered 2020-03-28: 4 mg via INTRAVENOUS
  Filled 2020-03-28: qty 2

## 2020-03-28 MED ORDER — LOPERAMIDE HCL 2 MG PO CAPS
2.0000 mg | ORAL_CAPSULE | Freq: Four times a day (QID) | ORAL | Status: DC | PRN
  Administered 2020-03-28: 2 mg via ORAL
  Filled 2020-03-28 (×2): qty 1

## 2020-03-28 MED ORDER — LACTATED RINGERS IV BOLUS
1000.0000 mL | Freq: Once | INTRAVENOUS | Status: AC
  Administered 2020-03-28: 14:00:00 1000 mL via INTRAVENOUS

## 2020-03-28 MED ORDER — INSULIN ASPART 100 UNIT/ML ~~LOC~~ SOLN: 0.0000 [IU] | SUBCUTANEOUS | Status: DC

## 2020-03-28 MED ORDER — VANCOMYCIN HCL IN DEXTROSE 1-5 GM/200ML-% IV SOLN
1000.0000 mg | Freq: Once | INTRAVENOUS | Status: AC
  Administered 2020-03-28: 1000 mg via INTRAVENOUS
  Filled 2020-03-28: qty 200

## 2020-03-28 NOTE — Hospital Course (Signed)
Admitted 03/28/2020  Allergies: Penicillins Pertinent Hx: ***  44 y.o. female p/w ***  * ***COVID-19 infection: presneted with  *AKI  Consults: ***  Meds: *** VTE ppx: *** IVF: *** Diet: ***

## 2020-03-28 NOTE — Progress Notes (Signed)
[facility] Progress Note Patient Name: Kristen Williamson DOB: [birth year] MRN: [id number]   Date of Service  03/28/2020  HPI/Events of Note  Pt with a recent past history of a pitiutary tumour, she also has a history of recent exposure to [place]. 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 She has been started on steroids. Incidental PCR positive COVID but CXR was unremarkable and she is on room air.  eICU Interventions  New Patient Evaluation completed.        Kerry Kass Marisue Canion 03/28/2020, 8:55 PM

## 2020-03-28 NOTE — Progress Notes (Signed)
Pharmacy Antibiotic Note  Kristen Williamson is a 44 y.o. female admitted on 03/28/2020 with sepsis.  Pharmacy has been consulted for Aztreonam and vancomycin dosing due to unspecified penicillin. Patient unable to recall what her allergy was.  SCr elevated at 1.87 (BL unknown). WBC wnl, LA 1.6.   Plan: Aztreonam 1 gm IV Q 8 hours Vancomycin 1750 mg IV Q 48 hours. Goal AUC 400-550. Expected AUC: 489 SCr used: 1.87  Monitor CBC, renal fx, cultures and clinical progress -Vanc levels as indicated    Height: 5\' 10"  (177.8 cm) Weight: 66.2 kg (146 lb) IBW/kg (Calculated) : 68.5  Temp (24hrs), Avg:103 F (39.4 C), Min:103 F (39.4 C), Max:103 F (39.4 C)  No results for input(s): WBC, CREATININE, LATICACIDVEN, VANCOTROUGH, VANCOPEAK, VANCORANDOM, GENTTROUGH, GENTPEAK, GENTRANDOM, TOBRATROUGH, TOBRAPEAK, TOBRARND, AMIKACINPEAK, AMIKACINTROU, AMIKACIN in the last 168 hours.  CrCl cannot be calculated (Patient's most recent lab result is older than the maximum 21 days allowed.).    Allergies  Allergen Reactions  . Penicillins       Thank you for allowing pharmacy to be a part of this patient's care.  Albertina Parr, PharmD., BCPS, BCCCP Clinical Pharmacist Clinical phone for 03/28/20 until 3:30pm: [phone] If after 3:30pm, please refer to [facility] for unit-specific pharmacist

## 2020-03-28 NOTE — ED Triage Notes (Signed)
Pt reports generalized weakness and fatigue for the past week, not eating or drinking much due to nausea. 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 Pt ill appearing in triage.

## 2020-03-28 NOTE — H&P (Signed)
NAME:  Kristen Williamson, MRN:  [id number], DOB:  24-Jan-1976, LOS: 0 ADMISSION DATE:  03/28/2020, CONSULTATION DATE:  4/21 REFERRING MD:  Wilson Singer, CHIEF COMPLAINT:  hypotension   Brief History   Covid+, shock, pituitary tumor, no previous history of endocrine issues  History of present illness   Kristen Williamson is a 44 y/o woman with a history of pituitary tumor who presents with 4 days of nausea, vomiting, diarrhea, dizziness. She has no known covid contacts, but [place] in the [employer] [place]. She was recently diagnosed with a pituitary tumor and will soon be starting radiation treatment. Prior to developing covid symptoms, she denies excessive fatigue, dizziness, edema, heart racing, hair falling out, GI symptoms, upper respiratory symptoms.  No dysuria.  She has previously been healthy.  No PTA meds.  Upon presentation to the ED she was febrile to 103 with a BP 85/56.  Covid PCR+. She has received over 4 L of crystalloid resuscitation without a significant change in her blood pressure.  Past Medical History  Pituitary tumor Birthmark removal (scar on forehead)  Significant Hospital Events     Consults:  PCCM TRH- IM  Procedures:    Significant Diagnostic Tests:  CT head 4/21: Mixed density mass in sella. No acute bleeding.  Left mastoid effusion, partial opacification of the sphenoid sinuses, mild thickening of paranasal sinuses.  Micro Data:  SARS-CoV-2 positive 4/21 blood>>   Antimicrobials:  Vancomycin 4/21>> Metronidazole 4/21>> Aztreonam 4/21>>  Interim history/subjective:    Objective   Blood pressure (!) 97/57, pulse 72, temperature 99 F (37.2 C), temperature source Oral, resp. rate 17, height 5\' 10"  (1.778 m), weight 66.2 kg, SpO2 100 %.       No intake or output data in the 24 hours ending 03/28/20 1721 Filed Weights   03/28/20 1058  Weight: 66.2 kg    Examination: General: Ill-appearing middle-aged woman lying in bed in no acute  distress HENT: [place]/AT, oral mucosa moist.  Normal extraocular motion. Lungs: Clear to auscultation bilaterally, breathing comfortably on room air, no conversational dyspnea. Cardiovascular: Regular rate and rhythm, no murmurs Abdomen: Soft, nontender, nondistended Extremities: No clubbing, cyanosis, or edema Neuro: Fatigued but awake and alert, answering questions appropriately, symmetric grip strength, moving all extremities spontaneously.  Extraocular motion intact. 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 Suspect adrenal crisis precipitated by viral infection. Negative workup thus far for acute bacterial infection. -Stress dose hydrocortisone-first dose given in the ED -Continue volume resuscitation -Admit to ICU for close blood pressure monitoring -Norepinephrine as needed to maintain MAP greater than 65 -Agree with broad-spectrum antibiotics, but hopefully will be able to quickly de-escalate. -Unfortunately the appropriate tube for drawing cortisol level was not drawn prior to its administration.  Additional endocrine work-up: Free T4, TSH, FSH, LH, growth hormone, prolactin level -Pending work-up, likely will require outpatient endocrinology evaluation  Anion gap metabolic acidosis; normal lactic acid level -Continue to monitor -Aggressive volume resuscitation -Antiemetics and antidiarrheals  Covid- 19 viral infection -Remdesivir per protocol -Stress dose steroids -Appropriate isolation -Serial D-dimer, ferritin -Anticoagulation per protocol  AKI, likely prerenal -Volume resuscitation -Continue to monitor -Treat hypotension -Renally dose meds and avoid nephrotoxic meds  Hyperbilirubinemia and elevated transaminases, likely due to viral infection and hypotension -Continue to  monitor  Hyponatremia, likely hypovolemic hyponatremia -Continue to monitor -We will  consider fludrocortisone, but primary adrenal insufficiency is less likely and secondary  Hyperglycemia -Monitor while on steroids -Goal BG 140-180 while presented to the ICU if requiring insulin -A1c pending  Best practice:  Diet: clear liquid, advance as tolerated Pain/Anxiety/Delirium protocol (if indicated): tylenol VAP protocol (if indicated): n/a DVT prophylaxis: enoxaparin GI prophylaxis: pepcid Glucose control: SSI Mobility: with assist Code Status: full Family Communication: mother is designated Air traffic controller- updated via video chat in the ED Disposition:  ICU  Labs   CBC: Recent Labs  Lab 03/28/20 1121  WBC 6.8  NEUTROABS 3.7  HGB 13.9  HCT 44.4  MCV 83.8  PLT [id number]    Basic Metabolic Panel: Recent Labs  Lab 03/28/20 1121  NA 133*  K 3.4*  CL 102  CO2 15*  GLUCOSE 108*  BUN 23*  CREATININE 1.87*  CALCIUM 9.0   GFR: Estimated Creatinine Clearance: 40.1 mL/min (A) (by C-G formula based on SCr of 1.87 mg/dL (H)). Recent Labs  Lab 03/28/20 1121  WBC 6.8  LATICACIDVEN 1.6    Liver Function Tests: Recent Labs  Lab 03/28/20 1121  AST 84*  ALT 52*  ALKPHOS 83  BILITOT 1.5*  PROT 7.6  ALBUMIN 4.2   No results for input(s): LIPASE, AMYLASE in the last 168 hours. No results for input(s): AMMONIA in the last 168 hours.  ABG No results found for: PHART, PCO2ART, PO2ART, HCO3, TCO2, ACIDBASEDEF, O2SAT   Coagulation Profile: Recent Labs  Lab 03/28/20 1121  INR 1.1    Cardiac Enzymes: No results for input(s): CKTOTAL, CKMB, CKMBINDEX, TROPONINI in the last 168 hours.  HbA1C: No results found for: HGBA1C  CBG: No results for input(s): GLUCAP in the last 168 hours.  Review of Systems:   +nausea, vomiting, fatigue, dizziness, trouble walking - rhinorrhea, nasal congestion, SOB, CP, edema, dysuria, urinary frequency, rashes, wounds, hair  falling out, palpitations, feeling cold/ hot  Past Medical History  She,  has a past medical history of Pituitary tumor.   Surgical History    Past Surgical History:  Procedure Laterality Date  . ABDOMINAL HYSTERECTOMY    . birth mark removed       Social History   reports that she has never smoked. She has never used smokeless tobacco. She reports current alcohol use. She reports that she does not use drugs.   Family History   Her family history is not on file.  She reports no significant family history   Allergies Allergies  Allergen Reactions  . Penicillins      Home Medications  Prior to Admission medications   Medication Sig Start Date End Date Taking? Authorizing Provider  clindamycin (CLEOCIN) 150 MG capsule Take 2 capsules (300 mg total) by mouth 3 (three) times daily. May dispense as 150mg  capsules 04/17/14   Piepenbrink, Anderson Malta, PA-C  Lactobacillus (ACIDOPHILUS PROBIOTIC) 10 MG TABS Take 1 tablet by mouth daily. 04/17/14   Piepenbrink, Anderson Malta, PA-C  lidocaine (XYLOCAINE) 2 % solution Use as directed 20 mLs in the mouth or throat as needed for mouth pain. 04/17/14   Piepenbrink, Anderson Malta, PA-C  nitrofurantoin, macrocrystal-monohydrate, (MACROBID) 100 MG capsule Take 1 capsule (100 mg total) by mouth 2 (two) times daily. 10/17/15   Nona Dell, PA-C  pseudoephedrine (SUDAFED) 60 MG tablet Take 1 tablet (60 mg total) by mouth every 6 (six) hours as needed for congestion. 10/17/15   Nona Dell, PA-C    This patient is critically ill with multiple organ system failure which requires  frequent high complexity decision making, assessment, support, evaluation, and titration of therapies. This was completed through the application of advanced monitoring technologies and extensive interpretation of multiple databases. During this encounter critical care time was devoted to patient care services described in this note for 50 minutes.    Julian Hy, DO  03/28/20 5:21 PM [facility]

## 2020-03-28 NOTE — ED Provider Notes (Signed)
[facility] DEPARTMENT Provider Note   CSN: [id number] Arrival date & time: 03/28/20  1043     History Chief Complaint  Patient presents with  . Weakness  . Fever    Kristen Williamson is a 44 y.o. female.  HPI   44 year old female with generalized fatigue/weakness.  Vomiting this morning.  She states it began feeling poorly about 3 days ago.  No appetite.  Very nauseated.  No urinary complaints.  No diarrhea.  Patient is extremely drowsy though it is hard to get a detailed history.  She denies any acute pain to me.  Triage note reviewed.  I question her about this potential brain tumor.  She tells me that she has some type of pituitary mass.  She tells me that she is not undergoing any type of treatment for it though currently.  Having a hard time understanding how this diagnosis came about or even where it was diagnosed.   History reviewed. No pertinent past medical history.  There are no problems to display for this patient.   Past Surgical History:  Procedure Laterality Date  . ABDOMINAL HYSTERECTOMY    . birth mark removed       OB History   No obstetric history on file.     No family history on file.  Social History   Tobacco Use  . Smoking status: Never Smoker  . Smokeless tobacco: Never Used  Substance Use Topics  . Alcohol use: Yes    Comment: occasional  . Drug use: No    Home Medications Prior to Admission medications   Medication Sig Start Date End Date Taking? Authorizing Provider  clindamycin (CLEOCIN) 150 MG capsule Take 2 capsules (300 mg total) by mouth 3 (three) times daily. May dispense as 150mg  capsules 04/17/14   Piepenbrink, Anderson Malta, PA-C  Lactobacillus (ACIDOPHILUS PROBIOTIC) 10 MG TABS Take 1 tablet by mouth daily. 04/17/14   Piepenbrink, Anderson Malta, PA-C  lidocaine (XYLOCAINE) 2 % solution Use as directed 20 mLs in the mouth or throat as needed for mouth pain. 04/17/14   Piepenbrink, Anderson Malta, PA-C  nitrofurantoin,  macrocrystal-monohydrate, (MACROBID) 100 MG capsule Take 1 capsule (100 mg total) by mouth 2 (two) times daily. 10/17/15   Nona Dell, PA-C  pseudoephedrine (SUDAFED) 60 MG tablet Take 1 tablet (60 mg total) by mouth every 6 (six) hours as needed for congestion. 10/17/15   Nona Dell, PA-C    Allergies    Penicillins  Review of Systems   Review of Systems All systems reviewed and negative, other than as noted in HPI.  Physical Exam Updated Vital Signs BP (!) 93/55   Pulse 75   Temp (!) 103 F (39.4 C) (Oral)   Resp 19   Ht 5\' 10"  (1.778 m)   Wt 66.2 kg   SpO2 97%   BMI 20.95 kg/m   Physical Exam Vitals and nursing note reviewed.  Constitutional:      General: She is not in acute distress.    Appearance: She is well-developed.     Comments: Laying in bed. Sleeping. Appears very tired but not toxic.   HENT:     Head: Normocephalic and atraumatic.  Eyes:     General:        Right eye: No discharge.        Left eye: No discharge.     Conjunctiva/sclera: Conjunctivae normal.  Cardiovascular:     Rate and Rhythm: Normal rate and regular rhythm.  Heart sounds: Normal heart sounds. No murmur. No friction rub. No gallop.   Pulmonary:     Effort: Pulmonary effort is normal. No respiratory distress.     Breath sounds: Normal breath sounds.  Abdominal:     General: There is no distension.     Palpations: Abdomen is soft.     Tenderness: There is no abdominal tenderness.  Musculoskeletal:        General: No tenderness.     Cervical back: Neck supple.  Skin:    General: Skin is warm and dry.     ED Results / Procedures / Treatments   Labs (all labs ordered are listed, but only abnormal results are displayed) Labs Reviewed  RESPIRATORY PANEL BY RT PCR (FLU A&B, COVID) - Abnormal; Notable for the following components:      Result Value   SARS Coronavirus 2 by RT PCR POSITIVE (*)    All other components within normal limits  COMPREHENSIVE  METABOLIC PANEL - Abnormal; Notable for the following components:   Sodium 133 (*)    Potassium 3.4 (*)    CO2 15 (*)    Glucose, Bld 108 (*)    BUN 23 (*)    Creatinine, Ser 1.87 (*)    AST 84 (*)    ALT 52 (*)    Total Bilirubin 1.5 (*)    GFR calc non Af Amer 32 (*)    GFR calc Af Amer 37 (*)    Anion gap 16 (*)    All other components within normal limits  CBC WITH DIFFERENTIAL/PLATELET - Abnormal; Notable for the following components:   RBC 5.30 (*)    All other components within normal limits  APTT - Abnormal; Notable for the following components:   aPTT 46 (*)    All other components within normal limits  URINALYSIS, ROUTINE W REFLEX MICROSCOPIC - Abnormal; Notable for the following components:   Color, Urine AMBER (*)    APPearance CLOUDY (*)    Ketones, ur 5 (*)    Protein, ur 30 (*)    Bacteria, UA RARE (*)    All other components within normal limits  CULTURE, BLOOD (ROUTINE X 2)  CULTURE, BLOOD (ROUTINE X 2)  URINE CULTURE  LACTIC ACID, PLASMA  PROTIME-INR  I-STAT BETA HCG BLOOD, ED ([facility], [facility], [facility] ONLY)    EKG EKG Interpretation  Date/Time:  Wednesday March 28 2020 11:12:32 EDT Ventricular Rate:  74 PR Interval:    QRS Duration: 135 QT Interval:  487 QTC Calculation: 541 R Axis:   22 Text Interpretation: Sinus rhythm Probable left ventricular hypertrophy Confirmed by Virgel Manifold [phone]) on 03/28/2020 11:32:28 AM   Radiology DG Chest Port 1 View  Result Date: 03/28/2020 CLINICAL DATA:  Pt reports generalized weakness and fatigue for the past week, not eating or drinking much due to nausea. 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 Pt ill appearing in triage EXAM: PORTABLE CHEST 1 VIEW COMPARISON:  None. FINDINGS: Normal heart, mediastinum and hila. Lungs are clear.  No pleural effusion or pneumothorax. Skeletal structures are grossly intact. IMPRESSION: No active disease. Electronically Signed   By: Lajean Manes M.D.   On:  03/28/2020 12:00    Procedures Procedures (including critical care time)  CRITICAL CARE Performed by: Virgel Manifold Total critical care time: 35 minutes Critical care time was exclusive of separately billable procedures and treating other patients. Critical care was necessary to treat or prevent imminent or life-threatening deterioration. Critical  care was time spent personally by me on the following activities: development of treatment plan with patient and/or surrogate as well as nursing, discussions with consultants, evaluation of patient's response to treatment, examination of patient, obtaining history from patient or surrogate, ordering and performing treatments and interventions, ordering and review of laboratory studies, ordering and review of radiographic studies, pulse oximetry and re-evaluation of patient's condition.   Medications Ordered in ED Medications  vancomycin (VANCOREADY) IVPB 1750 mg/350 mL (has no administration in time range)  aztreonam (AZACTAM) 1 g in sodium chloride 0.9 % 100 mL IVPB (has no administration in time range)  hydrocortisone sodium succinate (SOLU-CORTEF) 100 MG injection 100 mg (has no administration in time range)  lactated ringers bolus 1,000 mL (0 mLs Intravenous Stopped 03/28/20 1303)    And  lactated ringers bolus 1,000 mL (0 mLs Intravenous Stopped 03/28/20 1303)    And  lactated ringers bolus 250 mL (0 mLs Intravenous Stopped 03/28/20 1446)  aztreonam (AZACTAM) 2 g in sodium chloride 0.9 % 100 mL IVPB (0 g Intravenous Stopped 03/28/20 1238)  metroNIDAZOLE (FLAGYL) IVPB 500 mg (0 mg Intravenous Stopped 03/28/20 1340)  vancomycin (VANCOCIN) IVPB 1000 mg/200 mL premix (0 mg Intravenous Stopped 03/28/20 1446)  acetaminophen (TYLENOL) tablet 1,000 mg (1,000 mg Oral Given 03/28/20 1153)  lactated ringers bolus 1,000 mL (1,000 mLs Intravenous New Bag/Given 03/28/20 1340)  lactated ringers bolus 1,000 mL (1,000 mLs Intravenous New Bag/Given 03/28/20 1524)     ED Course  I have reviewed the triage vital signs and the nursing notes.  Pertinent labs & imaging results that were available during my care of the patient were reviewed by me and considered in my medical decision making (see chart for details).    MDM Rules/Calculators/A&P                      44 year old female with generalized fatigue, nausea, vomiting.  Noted to be febrile.  She is Covid positive.  She is stable from a respiratory standpoint.  She has been persistently hypotensive despite several liters of IV fluids at this point.  She probably has AKI to some degree.  Lactic acid is normal though which is somewhat reassuring.   She mentions a history of some type of pituitary mass although the specifics of this are not clear to me. Adrenal insufficiency from low ACTH levels could potentially cause similar symptoms of fatigue, nausea and hypotension.  This wouldn't explain the fever but an acute illness could be unmasking an underlying issue. Will give another liter of IVF. Critical care consulted. Consider stress dose of steroids and/or further work-up if she doesn't begin to respond.    Final Clinical Impression(s) / ED Diagnoses Final diagnoses:  COVID-19 virus infection  Hypotension, unspecified hypotension type    Rx / DC Orders ED Discharge Orders    None       Virgel Manifold, MD 03/28/20 1545

## 2020-03-29 DIAGNOSIS — I959 Hypotension, unspecified: Secondary | ICD-10-CM

## 2020-03-29 LAB — CBC WITH DIFFERENTIAL/PLATELET
Abs Immature Granulocytes: 0.01 10*3/uL (ref 0.00–0.07)
Basophils Absolute: 0 10*3/uL (ref 0.0–0.1)
Basophils Relative: 0 %
Eosinophils Absolute: 0 10*3/uL (ref 0.0–0.5)
Eosinophils Relative: 0 %
HCT: 37.6 % (ref 36.0–46.0)
Hemoglobin: 12.2 g/dL (ref 12.0–15.0)
Immature Granulocytes: 0 %
Lymphocytes Relative: 14 %
Lymphs Abs: 0.4 10*3/uL — ABNORMAL LOW (ref 0.7–4.0)
MCH: 26.6 pg (ref 26.0–34.0)
MCHC: 32.4 g/dL (ref 30.0–36.0)
MCV: 82.1 fL (ref 80.0–100.0)
Monocytes Absolute: 0.2 10*3/uL (ref 0.1–1.0)
Monocytes Relative: 5 %
Neutro Abs: 2.4 10*3/uL (ref 1.7–7.7)
Neutrophils Relative %: 81 %
Platelets: 144 10*3/uL — ABNORMAL LOW (ref 150–400)
RBC: 4.58 MIL/uL (ref 3.87–5.11)
RDW: 12.1 % (ref 11.5–15.5)
WBC: 3 10*3/uL — ABNORMAL LOW (ref 4.0–10.5)
nRBC: 0 % (ref 0.0–0.2)

## 2020-03-29 LAB — COMPREHENSIVE METABOLIC PANEL
ALT: 42 U/L (ref 0–44)
AST: 63 U/L — ABNORMAL HIGH (ref 15–41)
Albumin: 3.5 g/dL (ref 3.5–5.0)
Alkaline Phosphatase: 68 U/L (ref 38–126)
Anion gap: 10 (ref 5–15)
BUN: 13 mg/dL (ref 6–20)
CO2: 21 mmol/L — ABNORMAL LOW (ref 22–32)
Calcium: 9 mg/dL (ref 8.9–10.3)
Chloride: 109 mmol/L (ref 98–111)
Creatinine, Ser: 1.03 mg/dL — ABNORMAL HIGH (ref 0.44–1.00)
GFR calc Af Amer: >60 mL/min (ref >60)
GFR calc non Af Amer: >60 mL/min (ref >60)
Glucose, Bld: 120 mg/dL — ABNORMAL HIGH (ref 70–99)
Potassium: 4.1 mmol/L (ref 3.5–5.1)
Sodium: 140 mmol/L (ref 135–145)
Total Bilirubin: 1 mg/dL (ref 0.3–1.2)
Total Protein: 6.8 g/dL (ref 6.5–8.1)

## 2020-03-29 LAB — URINE CULTURE: Culture: NO GROWTH

## 2020-03-29 LAB — LACTATE DEHYDROGENASE: LDH: 303 U/L — ABNORMAL HIGH (ref 98–192)

## 2020-03-29 LAB — GLUCOSE, CAPILLARY
Glucose-Capillary: 113 mg/dL — ABNORMAL HIGH (ref 70–99)
Glucose-Capillary: 112 mg/dL — ABNORMAL HIGH (ref 70–99)

## 2020-03-29 LAB — FOLLICLE STIMULATING HORMONE: FSH: 0.8 m[IU]/mL

## 2020-03-29 LAB — MAGNESIUM: Magnesium: 2 mg/dL (ref 1.7–2.4)

## 2020-03-29 LAB — FERRITIN: Ferritin: 541 ng/mL — ABNORMAL HIGH (ref 11–307)

## 2020-03-29 LAB — D-DIMER, QUANTITATIVE: D-Dimer, Quant: 1.09 ug/mL-FEU — ABNORMAL HIGH (ref 0.00–0.50)

## 2020-03-29 LAB — PHOSPHORUS: Phosphorus: 3.6 mg/dL (ref 2.5–4.6)

## 2020-03-29 LAB — LUTEINIZING HORMONE: LH: <0.3 m[IU]/mL

## 2020-03-29 MED ORDER — SODIUM CHLORIDE 0.9 % IV SOLN
2.0000 g | Freq: Three times a day (TID) | INTRAVENOUS | Status: DC
  Filled 2020-03-29: qty 2

## 2020-03-29 MED ORDER — ORAL CARE MOUTH RINSE
15.0000 mL | Freq: Two times a day (BID) | OROMUCOSAL | Status: DC
  Administered 2020-03-29 – 2020-04-01 (×5): 15 mL via OROMUCOSAL

## 2020-03-29 MED ORDER — PREDNISONE 10 MG PO TABS: 10.0000 mg | ORAL_TABLET | Freq: Two times a day (BID) | ORAL | Status: DC

## 2020-03-29 MED ORDER — PREDNISONE 20 MG PO TABS
20.0000 mg | ORAL_TABLET | Freq: Two times a day (BID) | ORAL | Status: DC
  Administered 2020-03-29 – 2020-03-30 (×3): 20 mg via ORAL
  Filled 2020-03-29 (×3): qty 1

## 2020-03-29 MED ORDER — SODIUM CHLORIDE 0.9 % IV SOLN: INTRAVENOUS | Status: DC

## 2020-03-29 MED ORDER — PREDNISONE 5 MG PO TABS: 5.0000 mg | ORAL_TABLET | Freq: Two times a day (BID) | ORAL | Status: DC

## 2020-03-29 MED ORDER — CHLORHEXIDINE GLUCONATE 0.12 % MT SOLN
15.0000 mL | Freq: Two times a day (BID) | OROMUCOSAL | Status: DC
  Administered 2020-03-29 – 2020-04-01 (×7): 15 mL via OROMUCOSAL
  Filled 2020-03-29 (×7): qty 15

## 2020-03-29 MED ORDER — PREDNISONE 5 MG PO TABS: 5.0000 mg | ORAL_TABLET | Freq: Every day | ORAL | Status: DC

## 2020-03-29 MED ORDER — ENOXAPARIN SODIUM 40 MG/0.4ML ~~LOC~~ SOLN: 40.0000 mg | Freq: Two times a day (BID) | SUBCUTANEOUS | Status: DC

## 2020-03-29 MED ORDER — VANCOMYCIN HCL IN DEXTROSE 1-5 GM/200ML-% IV SOLN
1000.0000 mg | Freq: Two times a day (BID) | INTRAVENOUS | Status: DC
  Filled 2020-03-29: qty 200

## 2020-03-29 MED ORDER — ENOXAPARIN SODIUM 40 MG/0.4ML ~~LOC~~ SOLN
40.0000 mg | SUBCUTANEOUS | Status: DC
  Administered 2020-03-29 – 2020-04-01 (×4): 40 mg via SUBCUTANEOUS
  Filled 2020-03-29 (×4): qty 0.4

## 2020-03-29 NOTE — Progress Notes (Signed)
NAME:  Rebeccah Meixsell, MRN:  [id number], DOB:  1976-09-01, LOS: 1 ADMISSION DATE:  03/28/2020, CONSULTATION DATE:  4/21 REFERRING MD:  Wilson Singer, CHIEF COMPLAINT:  hypotension   Brief History   Covid+, shock, pituitary tumor, no previous history of endocrine issues  History of present illness   Ms. Lansdale is a 44 y/o woman with a history of pituitary tumor who presents with 4 days of nausea, vomiting, diarrhea, dizziness. She has no known covid contacts, but [place] in the [employer] [place]. She was recently diagnosed with a pituitary tumor and will soon be starting radiation treatment. Prior to developing covid symptoms, she denies excessive fatigue, dizziness, edema, heart racing, hair falling out, GI symptoms, upper respiratory symptoms.  No dysuria.  She has previously been healthy.  No PTA meds.  Upon presentation to the ED she was febrile to 103 with a BP 85/56.  Covid PCR+. She has received over 4 L of crystalloid resuscitation without a significant change in her blood pressure.  Past Medical History  Pituitary tumor Birthmark removal (scar on forehead)  Significant Hospital Events     Consults:  PCCM TRH- IM  Procedures:    Significant Diagnostic Tests:  CT head 4/21: Mixed density mass in sella. No acute bleeding.  Left mastoid effusion, partial opacification of the sphenoid sinuses, mild thickening of paranasal sinuses.  Micro Data:  SARS-CoV-2 positive 4/21 blood>>   Antimicrobials:  Vancomycin 4/21>> Metronidazole 4/21>> Aztreonam 4/21>>  Interim history/subjective:   4/22: pt is alert oriented and appropriately conversational. She reports persistent dizziness and inability to eat. No sob unless taking deep breaths, then she feels "tight". Stable for transfer from ICU.  Objective   Blood pressure 126/68, pulse 69, temperature 98.4 F (36.9 C), temperature source Oral, resp. rate 15, height 5\' 10"  (1.778 m), weight 83.2 kg, SpO2 96 %.         Intake/Output Summary (Last 24 hours) at 03/29/2020 1036 Last data filed at 03/29/2020 1021 Gross per 24 hour  Intake 454.31 ml  Output 1630 ml  Net -1175.69 ml   Filed Weights   03/28/20 1058 03/28/20 2017 03/29/20 0341  Weight: 66.2 kg 83.1 kg 83.2 kg    Examination: General: Ill-appearing middle-aged woman sitting up in bed in nad HENT: [place]/AT, oral mucosa moist.  Normal extraocular motion. Lungs: Clear to auscultation bilaterally, breathing comfortably on room air, no conversational dyspnea. Cardiovascular: Regular rate and rhythm, no murmurs Abdomen: Soft, nontender, nondistended Extremities: No clubbing, cyanosis, or edema Neuro: Fatigued but awake and alert, answering questions appropriately, symmetric grip strength, moving all extremities spontaneously.  Extraocular motion intact. 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 Suspect adrenal crisis precipitated by viral infection. Negative workup thus far for acute bacterial infection. -will decrease steroids with improvement in BP... follow and place taper. Should stay on low dose steroid tolerated until able to see endo as outpt.  -still with dizziness and unable to eat 2/2 to this.  -Continue NS at 6ml/hr until taking po with ongoing diarrhea -off pressor since last pm -stop abx today. -Unfortunately the appropriate tube for drawing cortisol level was not drawn prior to its administration.  Additional endocrine work-up: Free T4 (somewhat low but TSH normal) FSH and  LH normal -growth hormone, prolactin level pending -Pending work-up, likely will require outpatient endocrinology evaluation  Anion gap metabolic acidosis; normal lactic acid level -improved -cont NS until taking PO -Antiemetics and antidiarrheals  Covid- 19 viral  infection -Remdesivir per protocol... although without need for oxygen unsure of utility unless perhaps the covid infection was causing the stress state with hypotension and may benefit. This is not clear in data we have.  -Stress dose steroids.... weaning to maintence  dose until sees endo outpt -Appropriate isolation -Serial D-dimer, ferritin -Anticoagulation per protocol  AKI, likely prerenal -Volume resuscitation -Continue to monitor, improving -Treat hypotension -Renally dose meds and avoid nephrotoxic meds  Hyperbilirubinemia and elevated transaminases, likely due to viral infection and hypotension -Continue to monitor  Hyponatremia, likely hypovolemic hyponatremia -Continue to monitor -We will consider fludrocortisone, but primary adrenal insufficiency is less likely and secondary -improved  Hyperglycemia -Monitor while on steroids -Goal BG 140-180 while presented to the ICU if requiring insulin -A1c 5.6  Prolonged qt:  - >510 on ekg from 4/22 - avoid prolonging agents.   Best practice:  Diet: clear liquid, advance as tolerated Pain/Anxiety/Delirium protocol (if indicated): tylenol VAP protocol (if indicated): n/a DVT prophylaxis: enoxaparin GI prophylaxis: pepcid Glucose control: SSI Mobility: with assist Code Status: full Family Communication: with pt Disposition:  Transfer from ICU  Labs   CBC: Recent Labs  Lab 03/28/20 1121 03/29/20 0302  WBC 6.8 3.0*  NEUTROABS 3.7 2.4  HGB 13.9 12.2  HCT 44.4 37.6  MCV 83.8 82.1  PLT 173 144*    Basic Metabolic Panel: Recent Labs  Lab 03/28/20 1121 03/28/20 2003 03/29/20 0302  NA 133* 136 140  K 3.4* 3.9 4.1  CL 102 105 109  CO2 15* 21* 21*  GLUCOSE 108* 109* 120*  BUN 23* 16 13  CREATININE 1.87* 1.16* 1.03*  CALCIUM 9.0 8.8* 9.0  MG  --  1.8 2.0  PHOS  --  4.0 3.6   GFR: Estimated Creatinine Clearance: 81.9 mL/min (A) (by C-G formula based on SCr of 1.03 mg/dL (H)). Recent Labs  Lab  03/28/20 1121 03/29/20 0302  WBC 6.8 3.0*  LATICACIDVEN 1.6  --     Liver Function Tests: Recent Labs  Lab 03/28/20 1121 03/29/20 0302  AST 84* 63*  ALT 52* 42  ALKPHOS 83 68  BILITOT 1.5* 1.0  PROT 7.6 6.8  ALBUMIN 4.2 3.5   No results for input(s): LIPASE, AMYLASE in the last 168 hours. No results for input(s): AMMONIA in the last 168 hours.  ABG No results found for: PHART, PCO2ART, PO2ART, HCO3, TCO2, ACIDBASEDEF, O2SAT   Coagulation Profile: Recent Labs  Lab 03/28/20 1121  INR 1.1    Cardiac Enzymes: No results for input(s): CKTOTAL, CKMB, CKMBINDEX, TROPONINI in the last 168 hours.  HbA1C: Hgb A1c MFr Bld  Date/Time Value Ref Range Status  03/28/2020 05:22 PM 5.6 4.8 - 5.6 % Final    Comment:    (NOTE) Pre diabetes:          5.7%-6.4% Diabetes:              >6.4% Glycemic control for   <7.0% adults with diabetes     CBG: Recent Labs  Lab 03/28/20 1932 03/28/20 2006 03/28/20 2322 03/29/20 0336 03/29/20 0720  GLUCAP 84 81 100* 113* 112*     99233... 35 mins spent in care of the pt.     Audria Nine, DO 03/29/20 10:36 AM [facility]

## 2020-03-29 NOTE — Plan of Care (Signed)

## 2020-03-30 DIAGNOSIS — A4189 Other specified sepsis: Secondary | ICD-10-CM

## 2020-03-30 DIAGNOSIS — I9589 Other hypotension: Secondary | ICD-10-CM

## 2020-03-30 DIAGNOSIS — I959 Hypotension, unspecified: Secondary | ICD-10-CM | POA: Diagnosis not present

## 2020-03-30 DIAGNOSIS — U071 COVID-19: Principal | ICD-10-CM

## 2020-03-30 DIAGNOSIS — R001 Bradycardia, unspecified: Secondary | ICD-10-CM

## 2020-03-30 DIAGNOSIS — E861 Hypovolemia: Secondary | ICD-10-CM

## 2020-03-30 LAB — PROLACTIN: Prolactin: 13 ng/mL (ref 4.8–23.3)

## 2020-03-30 LAB — COMPREHENSIVE METABOLIC PANEL
ALT: 33 U/L (ref 0–44)
AST: 56 U/L — ABNORMAL HIGH (ref 15–41)
Albumin: 3 g/dL — ABNORMAL LOW (ref 3.5–5.0)
Alkaline Phosphatase: 48 U/L (ref 38–126)
Anion gap: 6 (ref 5–15)
BUN: 13 mg/dL (ref 6–20)
CO2: 25 mmol/L (ref 22–32)
Calcium: 8.5 mg/dL — ABNORMAL LOW (ref 8.9–10.3)
Chloride: 112 mmol/L — ABNORMAL HIGH (ref 98–111)
Creatinine, Ser: 0.89 mg/dL (ref 0.44–1.00)
GFR calc Af Amer: >60 mL/min (ref >60)
GFR calc non Af Amer: >60 mL/min (ref >60)
Glucose, Bld: 120 mg/dL — ABNORMAL HIGH (ref 70–99)
Potassium: 4.2 mmol/L (ref 3.5–5.1)
Sodium: 143 mmol/L (ref 135–145)
Total Bilirubin: 0.5 mg/dL (ref 0.3–1.2)
Total Protein: 5.7 g/dL — ABNORMAL LOW (ref 6.5–8.1)

## 2020-03-30 LAB — FERRITIN: Ferritin: 441 ng/mL — ABNORMAL HIGH (ref 11–307)

## 2020-03-30 LAB — D-DIMER, QUANTITATIVE: D-Dimer, Quant: 0.43 ug/mL-FEU (ref 0.00–0.50)

## 2020-03-30 LAB — GROWTH HORMONE: Growth Hormone: 0.2 ng/mL (ref 0.0–10.0)

## 2020-03-30 NOTE — Progress Notes (Signed)
PROGRESS NOTE                                                                                                                                                                                                             Patient Demographics:    Kristen Williamson, is a 44 y.o. female, DOB - 10-Dec-1975, UV:[id number]  Outpatient Primary MD for the patient is Patient, No Pcp Per   Admit date - 03/28/2020   LOS - 2  Chief Complaint  Patient presents with  . Weakness  . Fever       Brief Narrative: Patient is a 44 y.o. female with PMHx of pituitary tumor-who apparently is being scheduled for radiation/resection shortly-presented to the hospital on 4/21 with nausea, vomiting and diarrhea-found to have COVID-19.  She was hypotensive, tachycardic-and subsequently admitted by PCCM to the ICU-she was thought to have COVID-19 gastroenteritis and adrenal insufficiency causing hypotension.  Significant Events: 4/21>> admit to [facility] ICU-hypotensive-history of nausea/vomiting/diarrhea-COVID-19 positive 4/23>> bradycardia overnight-heart rate sustaining in the 40s/50s-briefly dipped down to the low 30s. 4/23>> transferred to [facility]  COVID-19 medications: Steroids: 4/21>> Remdesivir: 4/21>>  Antibiotics: Aztreonam: 4/21>> 4/22 Vancomycin: 4/21 x 1  Microbiology data: Blood culture: 4/21>> negative Urine culture: 4/21>> negative  DVT prophylaxis: SQ Lovenox  Procedures: None  Consults: PCCM    Subjective:    Kristen Williamson today continues to have some diarrhea but has slowed down significantly.  No vomiting.   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  Random cortisol on 4/21 was 7.3-which seems inappropriately low in relation to patient's clinical situation.  Patient/mother aware that patient will need outpatient  endocrinology follow-up.  Do not think patient had sepsis physiology contributing to hypotension-however probably did have some amount of hypovolemia causing hypotension as well.  Sinus bradycardia: Heart rate dipped down to the low 30s-recent TSH within normal limits-patient asymptomatic-but given severity of bradycardia-we will get cardiology evaluation.  COVID-19 infection with gastroenteritis: Chest x-ray without any infection-no respiratory symptoms-has already been started on remdesivir-this will be continued for total of 5 days.  Fever: afebrile  O2 requirements:  SpO2: 94 %   COVID-19 Labs: Recent Labs    03/29/20 0302 03/30/20 0639  DDIMER 1.09* 0.43  FERRITIN 541* 441*  LDH 303*  --  No results found for: BNP  No results for input(s): PROCALCITON in the last 168 hours.  Lab Results  Component Value Date   SARSCOV2NAA POSITIVE (A) 03/28/2020     Prone/Incentive Spirometry: encouraged  incentive spirometry use 3-4/hour.  AKI: Hemodynamically mediated-resolved  Anion gap metabolic acidosis: Secondary to AKI-resolved  Hypokalemia: Secondary to GI loss-resolved  Transaminitis: Mild-likely secondary to COVID-19-stable for further follow-up without any further work-up  History of pituitary tumor: Followed by Dr. Christella Noa (per patient)-Per patient plans are underway for surgical resection/radiation.  LH, FSH-on the lower side-further work-up/treatment will be deferred to the outpatient setting-when patient follows with endocrinology.  ABG: No results found for: PHART, PCO2ART, PO2ART, HCO3, TCO2, ACIDBASEDEF, O2SAT  Vent Settings: N/A  Condition - Stable  Family Communication  : Spoke with mother-patient was face timing with her when I was rounding.  Code Status :  Full Code  Diet :  Diet Order            Diet full liquid Room service appropriate? Yes; Fluid consistency: Thin  Diet effective now               Disposition Plan  : Status is:  Inpatient  Remains inpatient appropriate because:Inpatient level of care appropriate due to severity of illness   Dispo: The patient is from: Home              Anticipated d/c is to: Home              Anticipated d/c date is: 2 days              Patient currently is not medically stable to d/c.   Barriers to discharge: Bradycardia/complete 5 days of IV Remdesivir  Antimicorbials  :    Anti-infectives (From admission, onward)   Start     Dose/Rate Route Frequency Ordered Stop   03/30/20 0800  vancomycin (VANCOREADY) IVPB 1750 mg/350 mL  Status:  Discontinued     1,750 mg 175 mL/hr over 120 Minutes Intravenous Every 48 hours 03/28/20 1233 03/29/20 0806   03/29/20 1800  aztreonam (AZACTAM) 2 g in sodium chloride 0.9 % 100 mL IVPB  Status:  Discontinued     2 g 200 mL/hr over 30 Minutes Intravenous Every 8 hours 03/29/20 1044 03/29/20 1044   03/29/20 1000  remdesivir 100 mg in sodium chloride 0.9 % 100 mL IVPB     100 mg 200 mL/hr over 30 Minutes Intravenous Daily 03/28/20 1704 04/02/20 0959   03/29/20 1000  vancomycin (VANCOCIN) IVPB 1000 mg/200 mL premix  Status:  Discontinued     1,000 mg 200 mL/hr over 60 Minutes Intravenous Every 12 hours 03/29/20 0806 03/29/20 1043   03/28/20 2000  aztreonam (AZACTAM) 1 g in sodium chloride 0.9 % 100 mL IVPB  Status:  Discontinued     1 g 200 mL/hr over 30 Minutes Intravenous Every 8 hours 03/28/20 1233 03/29/20 1043   03/28/20 1715  remdesivir 200 mg in sodium chloride 0.9% 250 mL IVPB     200 mg 580 mL/hr over 30 Minutes Intravenous Once 03/28/20 1704 03/28/20 1902   03/28/20 1130  aztreonam (AZACTAM) 2 g in sodium chloride 0.9 % 100 mL IVPB     2 g 200 mL/hr over 30 Minutes Intravenous  Once 03/28/20 1109 03/28/20 1238   03/28/20 1115  metroNIDAZOLE (FLAGYL) IVPB 500 mg     500 mg 100 mL/hr over 60 Minutes Intravenous  Once 03/28/20 1109 03/28/20 1340   03/28/20  1115  vancomycin (VANCOCIN) IVPB 1000 mg/200 mL premix     1,000 mg 200  mL/hr over 60 Minutes Intravenous  Once 03/28/20 1109 03/28/20 1446      Inpatient Medications  Scheduled Meds: . chlorhexidine  15 mL Mouth Rinse BID  . Chlorhexidine Gluconate Cloth  6 each Topical Q0600  . enoxaparin (LOVENOX) injection  40 mg Subcutaneous Q24H  . famotidine  20 mg Oral Daily  . mouth rinse  15 mL Mouth Rinse q12n4p  . predniSONE  20 mg Oral BID WC   Followed by  . [START ON 04/02/2020] predniSONE  10 mg Oral BID WC   Followed by  . [START ON 04/06/2020] predniSONE  5 mg Oral BID WC   Followed by  . [START ON 04/11/2020] predniSONE  5 mg Oral Q breakfast   Continuous Infusions: . sodium chloride Stopped (03/28/20 1813)  . sodium chloride 75 mL/hr at 03/30/20 1238  . remdesivir 100 mg in NS 100 mL 100 mg (03/30/20 0908)   PRN Meds:.acetaminophen, docusate sodium, loperamide, ondansetron (ZOFRAN) IV, polyethylene glycol   Time Spent in minutes  25  See all Orders from today for further details   Oren Binet M.D on 03/30/2020 at 2:35 PM  To page go to [email or web address] - use universal password  [facility] -  Office  [phone]    Objective:   Vitals:   03/29/20 2054 03/30/20 0410 03/30/20 0910 03/30/20 1200  BP: 130/62 139/76 112/70 118/66  Pulse: (!) 53 90 (!) 50 (!) 50  Resp: 18 18 18 20   Temp: 98.9 F (37.2 C) 98 F (36.7 C) 98 F (36.7 C) 98 F (36.7 C)  TempSrc: Oral Oral Oral Oral  SpO2: 100% 100% 98% 94%  Weight:  78.1 kg    Height:        Wt Readings from Last 3 Encounters:  03/30/20 78.1 kg     Intake/Output Summary (Last 24 hours) at 03/30/2020 1435 Last data filed at 03/30/2020 1300 Gross per 24 hour  Intake 2161.45 ml  Output --  Net 2161.45 ml     Physical Exam Gen Exam:Alert awake-not in any distress HEENT:atraumatic, normocephalic Chest: B/L clear to auscultation anteriorly CVS:S1S2 regular Abdomen:soft non tender, non distended Extremities:no edema Neurology: Non focal Skin: no rash   Data Review:     CBC Recent Labs  Lab 03/28/20 1121 03/29/20 0302  WBC 6.8 3.0*  HGB 13.9 12.2  HCT 44.4 37.6  PLT 173 144*  MCV 83.8 82.1  MCH 26.2 26.6  MCHC 31.3 32.4  RDW 12.1 12.1  LYMPHSABS 2.3 0.4*  MONOABS 0.8 0.2  EOSABS 0.0 0.0  BASOSABS 0.0 0.0    Chemistries  Recent Labs  Lab 03/28/20 1121 03/28/20 2003 03/29/20 0302 03/30/20 0639  NA 133* 136 140 143  K 3.4* 3.9 4.1 4.2  CL 102 105 109 112*  CO2 15* 21* 21* 25  GLUCOSE 108* 109* 120* 120*  BUN 23* 16 13 13   CREATININE 1.87* 1.16* 1.03* 0.89  CALCIUM 9.0 8.8* 9.0 8.5*  MG  --  1.8 2.0  --   AST 84*  --  63* 56*  ALT 52*  --  42 33  ALKPHOS 83  --  68 48  BILITOT 1.5*  --  1.0 0.5   ------------------------------------------------------------------------------------------------------------------ No results for input(s): CHOL, HDL, LDLCALC, TRIG, CHOLHDL, LDLDIRECT in the last 72 hours.  Lab Results  Component Value Date   HGBA1C 5.6 03/28/2020   ------------------------------------------------------------------------------------------------------------------ Recent Labs  03/28/20 1630  TSH 1.482   ------------------------------------------------------------------------------------------------------------------ Recent Labs    03/29/20 0302 03/30/20 0639  FERRITIN 541* 441*    Coagulation profile Recent Labs  Lab 03/28/20 1121  INR 1.1    Recent Labs    03/29/20 0302 03/30/20 0639  DDIMER 1.09* 0.43    Cardiac Enzymes No results for input(s): CKMB, TROPONINI, MYOGLOBIN in the last 168 hours.  Invalid input(s): CK ------------------------------------------------------------------------------------------------------------------ No results found for: BNP  Micro Results Recent Results (from the past 240 hour(s))  Blood Culture (routine x 2)     Status: None (Preliminary result)   Collection Time: 03/28/20 11:21 AM   Specimen: BLOOD  Result Value Ref Range Status   Specimen  Description BLOOD RIGHT ANTECUBITAL  Final   Special Requests   Final    BOTTLES DRAWN AEROBIC AND ANAEROBIC Blood Culture adequate volume   Culture   Final    NO GROWTH 2 DAYS Performed at [facility], [address] [place], [place] [address]    Report Status PENDING  Incomplete  Blood Culture (routine x 2)     Status: None (Preliminary result)   Collection Time: 03/28/20 11:21 AM   Specimen: BLOOD  Result Value Ref Range Status   Specimen Description BLOOD RIGHT ANTECUBITAL  Final   Special Requests   Final    BOTTLES DRAWN AEROBIC AND ANAEROBIC Blood Culture results may not be optimal due to an inadequate volume of blood received in culture bottles   Culture   Final    NO GROWTH 2 DAYS Performed at [facility], [place] [address], [place], [place] [address]    Report Status PENDING  Incomplete  Urine culture     Status: None   Collection Time: 03/28/20 11:43 AM   Specimen: In/Out Cath Urine  Result Value Ref Range Status   Specimen Description IN/OUT CATH URINE  Final   Special Requests NONE  Final   Culture   Final    NO GROWTH Performed at [facility], [place] [address], [place], [place] [address]    Report Status 03/29/2020 FINAL  Final  Respiratory Panel by RT PCR (Flu A&B, Covid) - Nasopharyngeal Swab     Status: Abnormal   Collection Time: 03/28/20 11:43 AM   Specimen: Nasopharyngeal Swab  Result Value Ref Range Status   SARS Coronavirus 2 by RT PCR POSITIVE (A) NEGATIVE Final    Comment: RESULT CALLED TO, READ BACK BY AND VERIFIED WITH: Chalmers Cater RN 13:20 03/28/20 ([email or web address]) (NOTE) SARS-CoV-2 target nucleic acids are DETECTED. SARS-CoV-2 RNA is generally detectable in upper respiratory specimens  during the acute phase of infection. Positive results are indicative of the presence of the identified virus, but do not rule out bacterial infection or co-infection with other pathogens not detected by the test. Clinical correlation with patient history  and other diagnostic information is necessary to determine patient infection status. The expected result is Negative. Fact Sheet for Patients:  PinkCheek.be Fact Sheet for Healthcare Providers: GravelBags.it This test is not yet approved or cleared by the [place] FDA and  has been authorized for detection and/or diagnosis of SARS-CoV-2 by FDA under an Emergency Use Authorization (EUA).  This EUA will remain in effect (meaning this test can be used)  for the duration of  the COVID-19 declaration under Section 564(b)(1) of the Act, 21 U.S.C. section 360bbb-3(b)(1), unless the authorization is terminated or revoked sooner.    Influenza A by PCR NEGATIVE NEGATIVE Final  Influenza B by PCR NEGATIVE NEGATIVE Final    Comment: (NOTE) The Xpert Xpress SARS-CoV-2/FLU/RSV assay is intended as an aid in  the diagnosis of influenza from Nasopharyngeal swab specimens and  should not be used as a sole basis for treatment. Nasal washings and  aspirates are unacceptable for Xpert Xpress SARS-CoV-2/FLU/RSV  testing. Fact Sheet for Patients: PinkCheek.be Fact Sheet for Healthcare Providers: GravelBags.it This test is not yet approved or cleared by the [place] FDA and  has been authorized for detection and/or diagnosis of SARS-CoV-2 by  FDA under an Emergency Use Authorization (EUA). This EUA will remain  in effect (meaning this test can be used) for the duration of the  Covid-19 declaration under Section 564(b)(1) of the Act, 21  U.S.C. section 360bbb-3(b)(1), unless the authorization is  terminated or revoked. Performed at [facility], [place] [address], [place], [place] [address]   MRSA PCR Screening     Status: None   Collection Time: 03/28/20  8:05 PM   Specimen: Nasal Mucosa; Nasopharyngeal  Result Value Ref Range Status   MRSA by PCR NEGATIVE NEGATIVE Final     Comment:        The GeneXpert MRSA Assay (FDA approved for NASAL specimens only), is one component of a comprehensive MRSA colonization surveillance program. It is not intended to diagnose MRSA infection nor to guide or monitor treatment for MRSA infections. Performed at [facility], [place] [address], [place], [place] [address]     Radiology Reports CT Head Wo Contrast  Result Date: 03/28/2020 CLINICAL DATA:  44 year old female with altered mental status. Positive COVID-19. EXAM: CT HEAD WITHOUT CONTRAST TECHNIQUE: Contiguous axial images were obtained from the base of the skull through the vertex without intravenous contrast. COMPARISON:  None. FINDINGS: Brain: The ventricles and sulci appropriate in size for patient's age. The gray-white matter discrimination is preserved. There is no acute intracranial hemorrhage. No midline shift. 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 There is loss of continuity of the anterior sella with extension of the sellar tissue into the sphenoid sinuses. Dedicated pituitary MRI without and with contrast is recommended for better evaluation. Vascular: No hyperdense vessel or unexpected calcification. Skull: Normal. Negative for fracture or focal lesion. Sinuses/Orbits: There is mild mucoperiosteal thickening of paranasal sinuses. Partial opacification of the sphenoid sinuses. There is a 1.5 cm right maxillary sinus retention cyst or polyp. The right mastoid air cells are clear. Left mastoid effusions noted. Other: None IMPRESSION: 1. No acute intracranial hemorrhage. 2. Heterogeneous sellar mass as above. Dedicated pituitary MRI without and with contrast is recommended for better evaluation. 3. Left mastoid effusions. Electronically Signed   By: Anner Crete M.D.   On: 03/28/2020 17:01   DG Chest Port 1 View  Result Date: 03/28/2020 CLINICAL DATA:  Pt reports generalized weakness and fatigue for the past  week, not eating or drinking much due to nausea. 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 Pt ill appearing in triage EXAM: PORTABLE CHEST 1 VIEW COMPARISON:  None. FINDINGS: Normal heart, mediastinum and hila. Lungs are clear.  No pleural effusion or pneumothorax. Skeletal structures are grossly intact. IMPRESSION: No active disease. Electronically Signed   By: Lajean Manes M.D.   On: 03/28/2020 12:00

## 2020-03-30 NOTE — Progress Notes (Signed)
Tele called and said that Pt's HR had dropped to 33 r/t a 2.33 sec pause. Also looked like some Afib. HR nonsustained and appears sinus brady in 50s on monitor. Dr Sloan Leiter verbally notified and gave orders for EKG.Pt's RN also notified.

## 2020-03-30 NOTE — Consult Note (Signed)
Cardiology Consultation:   Patient ID: Kristen Williamson MRN: [id number]; DOB: 12-Jun-1976  Admit date: 03/28/2020 Date of Consult: 03/30/2020  Primary Care Provider: Patient, No Pcp Per Primary Cardiologist: No primary care provider on file.  Primary Electrophysiologist:  None   Patient Profile:   Klhoe Williamson is a 44 y.o. female with a hx of pituitary tumor (with planned resection) who is being seen today for the evaluation of bradycardia at the request of Dr. Sloan Leiter.  History of Present Illness:   [name] has not been seen by cardiology in the past. No h/o of MI, stent, CHF, arrhythmia, stroke, DM, HTN, HLD. [place] Prior to covid she has no chest pain, palpitations, orthopnea. Says she has chronic edema which has been stable. Not on medications at baseline. Mom with history if HTN.   The patient presented to the ED 03/28/20 for 4 days of vomiting, diarrhea, and dizziness. No sob or chest pain. In the ED she was febrile with BP 85/56. COVID positive. She received 4L IVF in the ED without much BP improvement. She was started on IV abx and given steroids and admitted to ICU and recently transferred to [facility]. Patient had bradycardia overnight and cardiology was consulted.   Past Medical History:  Diagnosis Date  . Pituitary tumor     Past Surgical History:  Procedure Laterality Date  . ABDOMINAL HYSTERECTOMY    . birth mark removed       Home Medications:  Prior to Admission medications   Medication Sig Start Date End Date Taking? Authorizing Provider  clindamycin (CLEOCIN) 150 MG capsule Take 2 capsules (300 mg total) by mouth 3 (three) times daily. May dispense as 150mg  capsules Patient not taking: Reported on 03/28/2020 04/17/14   Piepenbrink, Anderson Malta, PA-C  Lactobacillus (ACIDOPHILUS PROBIOTIC) 10 MG TABS Take 1 tablet by mouth daily. Patient not taking: Reported on 03/28/2020 04/17/14   Piepenbrink, Anderson Malta, PA-C  lidocaine (XYLOCAINE) 2  % solution Use as directed 20 mLs in the mouth or throat as needed for mouth pain. Patient not taking: Reported on 03/28/2020 04/17/14   Piepenbrink, Anderson Malta, PA-C  nitrofurantoin, macrocrystal-monohydrate, (MACROBID) 100 MG capsule Take 1 capsule (100 mg total) by mouth 2 (two) times daily. Patient not taking: Reported on 03/28/2020 10/17/15   Nona Dell, PA-C  pseudoephedrine (SUDAFED) 60 MG tablet Take 1 tablet (60 mg total) by mouth every 6 (six) hours as needed for congestion. Patient not taking: Reported on 03/28/2020 10/17/15   Nona Dell, PA-C    Inpatient Medications: Scheduled Meds: . chlorhexidine  15 mL Mouth Rinse BID  . Chlorhexidine Gluconate Cloth  6 each Topical Q0600  . enoxaparin (LOVENOX) injection  40 mg Subcutaneous Q24H  . famotidine  20 mg Oral Daily  . mouth rinse  15 mL Mouth Rinse q12n4p  . predniSONE  20 mg Oral BID WC   Followed by  . [START ON 04/02/2020] predniSONE  10 mg Oral BID WC   Followed by  . [START ON 04/06/2020] predniSONE  5 mg Oral BID WC   Followed by  . [START ON 04/11/2020] predniSONE  5 mg Oral Q breakfast   Continuous Infusions: . sodium chloride Stopped (03/28/20 1813)  . sodium chloride 10 mL/hr at 03/30/20 1541  . remdesivir 100 mg in NS 100 mL 100 mg (03/30/20 0908)   PRN Meds: acetaminophen, docusate sodium, loperamide, ondansetron (ZOFRAN) IV, polyethylene glycol  Allergies:    Allergies  Allergen Reactions  . Penicillins  Other (See Comments)    Reaction not recalled by the patient    Social History:   Social History   Socioeconomic History  . Marital status: Single    Spouse name: Not on file  . Number of children: Not on file  . Years of education: Not on file  . Highest education level: Not on file  Occupational History  . Not on file  Tobacco Use  . Smoking status: Never Smoker  . Smokeless tobacco: Never Used  Substance and Sexual Activity  . Alcohol use: Yes    Comment: occasional  .  Drug use: No  . Sexual activity: Yes    Birth control/protection: Surgical  Other Topics Concern  . Not on file  Social History Narrative  . Not on file   Social Determinants of Health   Financial Resource Strain:   . Difficulty of Paying Living Expenses:   Food Insecurity:   . Worried About Charity fundraiser in the Last Year:   . Arboriculturist in the Last Year:   Transportation Needs:   . Film/video editor (Medical):   Marland Kitchen Lack of Transportation (Non-Medical):   Physical Activity:   . Days of Exercise per Week:   . Minutes of Exercise per Session:   Stress:   . Feeling of Stress :   Social Connections:   . Frequency of Communication with Friends and Family:   . Frequency of Social Gatherings with Friends and Family:   . Attends Religious Services:   . Active Member of Clubs or Organizations:   . Attends Archivist Meetings:   Marland Kitchen Marital Status:   Intimate Partner Violence:   . Fear of Current or Ex-Partner:   . Emotionally Abused:   Marland Kitchen Physically Abused:   . Sexually Abused:     Family History:   *History reviewed. No pertinent family history.   ROS:  Please see the history of present illness.  All other ROS reviewed and negative.     Physical Exam/Data:   Vitals:   03/30/20 0410 03/30/20 0910 03/30/20 1200 03/30/20 1634  BP: 139/76 112/70 118/66   Pulse: 90 (!) 50 (!) 50 (!) 54  Resp: 18 18 20    Temp: 98 F (36.7 C) 98 F (36.7 C) 98 F (36.7 C)   TempSrc: Oral Oral Oral   SpO2: 100% 98% 94%   Weight: 78.1 kg     Height:        Intake/Output Summary (Last 24 hours) at 03/30/2020 1634 Last data filed at 03/30/2020 1300 Gross per 24 hour  Intake 1861.45 ml  Output -  Net 1861.45 ml   Last 3 Weights 03/30/2020 03/29/2020 03/29/2020  Weight (lbs) 172 lb 2.9 oz 172 lb 2.9 oz 183 lb 6.8 oz  Weight (kg) 78.1 kg 78.1 kg 83.2 kg     Body mass index is 24.71 kg/m.  General:  Well nourished, well developed, in no acute distress HEENT: normal  Lymph: no adenopathy Neck: no JVD Endocrine:  No thryomegaly Vascular: No carotid bruits; FA pulses 2+ bilaterally without bruits  Cardiac:  normal S1, S2; RRR; no murmur  Lungs:  clear to auscultation bilaterally, no wheezing, rhonchi or rales  Abd: soft, nontender, no hepatomegaly  Ext: no edema Musculoskeletal:  No deformities, BUE and BLE strength normal and equal Skin: warm and dry  Neuro:  CNs 2-12 intact, no focal abnormalities noted Psych:  Normal affect   EKG:  The EKG was personally reviewed  and demonstrates:  Sinus bradycardia, 46bpm, no ST/T wave changes Telemetry:  Telemetry was personally reviewed and demonstrates:  Sinus bradycardia, HR mid 40s to 50s  Relevant CV Studies:  Echo ordered  Laboratory Data:  High Sensitivity Troponin:  No results for input(s): TROPONINIHS in the last 720 hours.   Chemistry Recent Labs  Lab 03/28/20 2003 03/29/20 0302 03/30/20 0639  NA 136 140 143  K 3.9 4.1 4.2  CL 105 109 112*  CO2 21* 21* 25  GLUCOSE 109* 120* 120*  BUN 16 13 13   CREATININE 1.16* 1.03* 0.89  CALCIUM 8.8* 9.0 8.5*  GFRNONAA 57* >60 >60  GFRAA >60 >60 >60  ANIONGAP 10 10 6     Recent Labs  Lab 03/28/20 1121 03/29/20 0302 03/30/20 0639  PROT 7.6 6.8 5.7*  ALBUMIN 4.2 3.5 3.0*  AST 84* 63* 56*  ALT 52* 42 33  ALKPHOS 83 68 48  BILITOT 1.5* 1.0 0.5   Hematology Recent Labs  Lab 03/28/20 1121 03/29/20 0302  WBC 6.8 3.0*  RBC 5.30* 4.58  HGB 13.9 12.2  HCT 44.4 37.6  MCV 83.8 82.1  MCH 26.2 26.6  MCHC 31.3 32.4  RDW 12.1 12.1  PLT 173 144*   BNPNo results for input(s): BNP, PROBNP in the last 168 hours.  DDimer  Recent Labs  Lab 03/29/20 0302 03/30/20 0639  DDIMER 1.09* 0.43     Radiology/Studies:  CT Head Wo Contrast  Result Date: 03/28/2020 CLINICAL DATA:  44 year old female with altered mental status. Positive COVID-19. EXAM: CT HEAD WITHOUT CONTRAST TECHNIQUE: Contiguous axial images were obtained from the base of the skull  through the vertex without intravenous contrast. COMPARISON:  None. FINDINGS: Brain: The ventricles and sulci appropriate in size for patient's age. The gray-white matter discrimination is preserved. There is no acute intracranial hemorrhage. No midline shift. 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 There is loss of continuity of the anterior sella with extension of the sellar tissue into the sphenoid sinuses. Dedicated pituitary MRI without and with contrast is recommended for better evaluation. Vascular: No hyperdense vessel or unexpected calcification. Skull: Normal. Negative for fracture or focal lesion. Sinuses/Orbits: There is mild mucoperiosteal thickening of paranasal sinuses. Partial opacification of the sphenoid sinuses. There is a 1.5 cm right maxillary sinus retention cyst or polyp. The right mastoid air cells are clear. Left mastoid effusions noted. Other: None IMPRESSION: 1. No acute intracranial hemorrhage. 2. Heterogeneous sellar mass as above. Dedicated pituitary MRI without and with contrast is recommended for better evaluation. 3. Left mastoid effusions. Electronically Signed   By: Anner Crete M.D.   On: 03/28/2020 17:01   DG Chest Port 1 View  Result Date: 03/28/2020 CLINICAL DATA:  Pt reports generalized weakness and fatigue for the past week, not eating or drinking much due to nausea. 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 Pt ill appearing in triage EXAM: PORTABLE CHEST 1 VIEW COMPARISON:  None. FINDINGS: Normal heart, mediastinum and hila. Lungs are clear.  No pleural effusion or pneumothorax. Skeletal structures are grossly intact. IMPRESSION: No active disease. Electronically Signed   By: Lajean Manes M.D.   On: 03/28/2020 12:00     Assessment and Plan:   Bradycardia - Heart rates down into the 40s and 50s overnight in the setting of COVID infection and possible adrenal crisis - Patient is not on rate  lowering medications - EKG on admission with sinus with  rates in the 70s. EKG today shows sinus brady with HR 46.  - Pressures now stable. 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 Was previously on Norepinephrine - on stress dose steroids - IVF - Pressures now improved, most recent 118/66 - Plan for outpatient follow-up with endocrinology Dr. Christella Noa  COVID Infection/gastroenteritis - Blood cultures negative - CXR with no acute infection - IV remdesivir and steroids - per IM  AKI - IVF - creatinine improving 1.03 >0.89  For questions or updates, please contact [facility] Please consult [email or web address] for contact info under   Signed, Cadence Ninfa Meeker, PA-C  03/30/2020 4:34 PM   The patient was seen, examined and discussed with Cadence Ninfa Meeker, PA-C   and I agree with the above.   A pleasant 44 y.o. female with a hx of pituitary tumor (with planned resection) who is being seen today for the evaluation of bradycardia.  The patient has no cardiac history.  She is not very active but denies any chest pain, shortness of breath, lower extremity edema orthopnea proximal nocturnal dyspnea.  She felt slightly dizzy this morning when she first got out of bed but she has no prior such episodes and no history of syncope.  No family history of coronary artery disease or sudden cardiac death.  She presented to the ED on 03/28/2020 after 4 days of vomiting, diarrhea, and dizziness. No sob or chest pain. In the ED she was febrile and septic with BP 85/56. COVID positive. She received 4L IVF in the ED without much BP improvement. She was started on IV abx and given steroids and admitted to ICU and recently transferred to [facility]. Patient had bradycardia overnight and cardiology was consulted.    Telemetry shows sinus bradycardia with ventricular rates in upper 40s and low 50s.  Her EKG shows sinus bradycardia with ventricular rate 51 bpm, somewhat low voltage QRS otherwise normal EKG.  On physical exam she is not in acute distress, she has no JVDs, S1-S2 without murmurs, her lungs are clear and lower extremities are warm and have good pulses.  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  Continue telemetry, she does not require any support right now but if she develops worsening bradycardia or block and infusion of dobutamine or isoproterenol can be considered.  We will follow.  Ena Dawley, MD 03/30/2020

## 2020-03-30 NOTE — Plan of Care (Signed)
  Problem: Clinical Measurements: Goal: Respiratory complications will improve Outcome: Progressing   

## 2020-03-31 ENCOUNTER — Inpatient Hospital Stay (HOSPITAL_COMMUNITY): Payer: Medicaid Other

## 2020-03-31 DIAGNOSIS — I959 Hypotension, unspecified: Secondary | ICD-10-CM | POA: Diagnosis not present

## 2020-03-31 DIAGNOSIS — R579 Shock, unspecified: Secondary | ICD-10-CM | POA: Diagnosis not present

## 2020-03-31 DIAGNOSIS — R9431 Abnormal electrocardiogram [ECG] [EKG]: Secondary | ICD-10-CM

## 2020-03-31 LAB — COMPREHENSIVE METABOLIC PANEL
ALT: 34 U/L (ref 0–44)
AST: 74 U/L — ABNORMAL HIGH (ref 15–41)
Albumin: 3.3 g/dL — ABNORMAL LOW (ref 3.5–5.0)
Alkaline Phosphatase: 46 U/L (ref 38–126)
Anion gap: 8 (ref 5–15)
BUN: 12 mg/dL (ref 6–20)
CO2: 25 mmol/L (ref 22–32)
Calcium: 8.4 mg/dL — ABNORMAL LOW (ref 8.9–10.3)
Chloride: 109 mmol/L (ref 98–111)
Creatinine, Ser: 0.81 mg/dL (ref 0.44–1.00)
GFR calc Af Amer: >60 mL/min (ref >60)
GFR calc non Af Amer: >60 mL/min (ref >60)
Glucose, Bld: 134 mg/dL — ABNORMAL HIGH (ref 70–99)
Potassium: 3.9 mmol/L (ref 3.5–5.1)
Sodium: 142 mmol/L (ref 135–145)
Total Bilirubin: 0.7 mg/dL (ref 0.3–1.2)
Total Protein: 6.1 g/dL — ABNORMAL LOW (ref 6.5–8.1)

## 2020-03-31 LAB — CBC
HCT: 34.7 % — ABNORMAL LOW (ref 36.0–46.0)
Hemoglobin: 10.8 g/dL — ABNORMAL LOW (ref 12.0–15.0)
MCH: 25.8 pg — ABNORMAL LOW (ref 26.0–34.0)
MCHC: 31.1 g/dL (ref 30.0–36.0)
MCV: 83 fL (ref 80.0–100.0)
Platelets: 119 10*3/uL — ABNORMAL LOW (ref 150–400)
RBC: 4.18 MIL/uL (ref 3.87–5.11)
RDW: 12.2 % (ref 11.5–15.5)
WBC: 2.3 10*3/uL — ABNORMAL LOW (ref 4.0–10.5)
nRBC: 0 % (ref 0.0–0.2)

## 2020-03-31 LAB — D-DIMER, QUANTITATIVE: D-Dimer, Quant: 0.61 ug/mL-FEU — ABNORMAL HIGH (ref 0.00–0.50)

## 2020-03-31 LAB — MAGNESIUM: Magnesium: 2.2 mg/dL (ref 1.7–2.4)

## 2020-03-31 LAB — C-REACTIVE PROTEIN: CRP: 2.2 mg/dL — ABNORMAL HIGH (ref <1.0)

## 2020-03-31 LAB — ECHOCARDIOGRAM COMPLETE
Height: 70 in
Weight: 2684.8 oz

## 2020-03-31 LAB — FERRITIN: Ferritin: 499 ng/mL — ABNORMAL HIGH (ref 11–307)

## 2020-03-31 MED ORDER — PREDNISONE 10 MG PO TABS
10.0000 mg | ORAL_TABLET | Freq: Every day | ORAL | Status: DC
  Administered 2020-03-31 – 2020-04-01 (×2): 10 mg via ORAL
  Filled 2020-03-31 (×2): qty 1

## 2020-03-31 NOTE — Progress Notes (Signed)
  Echocardiogram 2D Echocardiogram has been performed.  Michiel Cowboy 03/31/2020, 9:33 AM

## 2020-03-31 NOTE — Progress Notes (Signed)
PROGRESS NOTE                                                                                                                                                                                                             Kristen Williamson Demographics:    Kristen Williamson, is a 44 y.o. female, DOB - 1976/05/24, GX:[id number]  Outpatient Primary MD for the Kristen Williamson is Kristen Williamson, No Pcp Per   Admit date - 03/28/2020   LOS - 3  Chief Complaint  Kristen Williamson presents with  . Weakness  . Fever       Brief Narrative: Kristen Williamson is a 44 y.o. female with PMHx of pituitary tumor-who apparently is being scheduled for radiation/resection shortly-presented to the hospital on 4/21 with nausea, vomiting and diarrhea-found to have COVID-19.  She was hypotensive, tachycardic-and subsequently admitted by PCCM to the ICU-she was thought to have COVID-19 gastroenteritis and adrenal insufficiency causing hypotension.  Significant Events: 4/21>> admit to [facility] ICU-hypotensive-history of nausea/vomiting/diarrhea-COVID-19 positive 4/23>> bradycardia overnight-heart rate sustaining in the 40s/50s-briefly dipped down to the low 30s. 4/23>> transferred to [facility]  COVID-19 medications: Steroids: 4/21>> Remdesivir: 4/21>>  Antibiotics: Aztreonam: 4/21>> 4/22 Vancomycin: 4/21 x 1  Microbiology data: Blood culture: 4/21>> negative Urine culture: 4/21>> negative  Consults: PCCM, Cards    Subjective:   Kristen Williamson in bed, appears comfortable, denies any headache, no fever, no chest pain or pressure, no shortness of breath , no abdominal pain. No focal weakness.    Assessment  & Plan :   Hypotension secondary to adrenal insufficiency related to pituitary tumor along with some element of dehydration from gastroenteritis : BP now stable-remains on tapering steroids.  Random cortisol on 4/21 was 7.3-which seems inappropriately low in relation to Kristen Williamson's clinical  situation.  Kristen Williamson/mother aware that Kristen Williamson will need outpatient endocrinology follow-up.  On oral prednisone will continue to gently taper after she has been adequately hydrated, will require outpatient endocrine follow-up.  Blood pressure is improved after initial hydration.   Sinus bradycardia: Heart rate dipped down to the low 30s-recent TSH within normal limits-Kristen Williamson asymptomatic-but given severity of bradycardia-cardiology following and echo pending.  COVID-19 infection with gastroenteritis: Chest x-ray without any infection-no respiratory symptoms-has already been started on remdesivir-this will be continued for total of 5 days.     Recent Labs  Lab 03/28/20 1121 03/28/20 1630 03/28/20 1722 03/28/20  2003 03/29/20 0302 03/30/20 0639 03/31/20 0303  NA 133*  --   --  136 140 143 142  K 3.4*  --   --  3.9 4.1 4.2 3.9  CL 102  --   --  105 109 112* 109  CO2 15*  --   --  21* 21* 25 25  GLUCOSE 108*  --   --  109* 120* 120* 134*  BUN 23*  --   --  16 13 13 12   CREATININE 1.87*  --   --  1.16* 1.03* 0.89 0.81  CALCIUM 9.0  --   --  8.8* 9.0 8.5* 8.4*  AST 84*  --   --   --  63* 56* 74*  ALT 52*  --   --   --  42 33 34  ALKPHOS 83  --   --   --  68 48 46  BILITOT 1.5*  --   --   --  1.0 0.5 0.7  ALBUMIN 4.2  --   --   --  3.5 3.0* 3.3*  MG  --   --   --  1.8 2.0  --  2.2  CRP  --   --   --   --   --   --  2.2*  DDIMER  --   --   --   --  1.09* 0.43 0.61*  INR 1.1  --   --   --   --   --   --   TSH  --  1.482  --   --   --   --   --   HGBA1C  --   --  5.6  --   --   --   --      AKI: Hemodynamically mediated-resolved  Anion gap metabolic acidosis: Secondary to AKI-resolved  Transaminitis: Mild-likely secondary to COVID-19-stable for further follow-up without any further work-up  History of pituitary tumor: Followed by Dr. Christella Noa (per Kristen Williamson)- Per Kristen Williamson plans are underway for surgical resection/radiation.  LH, FSH-on the lower side-further work-up/treatment will be  deferred to the outpatient setting-when Kristen Williamson follows with endocrinology.     Condition - Stable  Family Communication  : Previous MD spoke with the Kristen Williamson's mother on 03/30/2020.  Procedures:  TTE  Code Status :  Full Code  Diet :  Diet Order            Diet regular Room service appropriate? Yes; Fluid consistency: Thin  Diet effective now               Disposition Plan  : Stay inpatient in the hospital to complete the course of IV remdesivir for COVID-19 pneumonia, also has severe adrenal insufficiency requiring medications and work-up.  Once stable discharge home.   Antimicorbials  :    Anti-infectives (From admission, onward)   Start     Dose/Rate Route Frequency Ordered Stop   03/30/20 0800  vancomycin (VANCOREADY) IVPB 1750 mg/350 mL  Status:  Discontinued     1,750 mg 175 mL/hr over 120 Minutes Intravenous Every 48 hours 03/28/20 1233 03/29/20 0806   03/29/20 1800  aztreonam (AZACTAM) 2 g in sodium chloride 0.9 % 100 mL IVPB  Status:  Discontinued     2 g 200 mL/hr over 30 Minutes Intravenous Every 8 hours 03/29/20 1044 03/29/20 1044   03/29/20 1000  remdesivir 100 mg in sodium chloride 0.9 % 100 mL IVPB     100 mg 200  mL/hr over 30 Minutes Intravenous Daily 03/28/20 1704 04/02/20 0959   03/29/20 1000  vancomycin (VANCOCIN) IVPB 1000 mg/200 mL premix  Status:  Discontinued     1,000 mg 200 mL/hr over 60 Minutes Intravenous Every 12 hours 03/29/20 0806 03/29/20 1043   03/28/20 2000  aztreonam (AZACTAM) 1 g in sodium chloride 0.9 % 100 mL IVPB  Status:  Discontinued     1 g 200 mL/hr over 30 Minutes Intravenous Every 8 hours 03/28/20 1233 03/29/20 1043   03/28/20 1715  remdesivir 200 mg in sodium chloride 0.9% 250 mL IVPB     200 mg 580 mL/hr over 30 Minutes Intravenous Once 03/28/20 1704 03/28/20 1902   03/28/20 1130  aztreonam (AZACTAM) 2 g in sodium chloride 0.9 % 100 mL IVPB     2 g 200 mL/hr over 30 Minutes Intravenous  Once 03/28/20 1109 03/28/20 1238    03/28/20 1115  metroNIDAZOLE (FLAGYL) IVPB 500 mg     500 mg 100 mL/hr over 60 Minutes Intravenous  Once 03/28/20 1109 03/28/20 1340   03/28/20 1115  vancomycin (VANCOCIN) IVPB 1000 mg/200 mL premix     1,000 mg 200 mL/hr over 60 Minutes Intravenous  Once 03/28/20 1109 03/28/20 1446     DVT prophylaxis: SQ Lovenox  Inpatient Medications  Scheduled Meds: . chlorhexidine  15 mL Mouth Rinse BID  . Chlorhexidine Gluconate Cloth  6 each Topical Q0600  . enoxaparin (LOVENOX) injection  40 mg Subcutaneous Q24H  . famotidine  20 mg Oral Daily  . mouth rinse  15 mL Mouth Rinse q12n4p  . predniSONE  10 mg Oral Q breakfast   Continuous Infusions: . sodium chloride Stopped (03/28/20 1813)  . remdesivir 100 mg in NS 100 mL 100 mg (03/31/20 1019)   PRN Meds:.acetaminophen, docusate sodium, loperamide, ondansetron (ZOFRAN) IV, polyethylene glycol   Time Spent in minutes  25  See all Orders from today for further details   Lala Lund M.D on 03/31/2020 at 12:21 PM  To page go to [email or web address] - use universal password  [facility] -  Office  [phone]    Objective:   Vitals:   03/31/20 0014 03/31/20 0327 03/31/20 0453 03/31/20 1015  BP: (!) 143/84  (!) 157/81 (!) 150/94  Pulse: (!) 46  (!) 41 (!) 55  Resp: 18  18 18   Temp: 98.6 F (37 C)  98.4 F (36.9 C) 98 F (36.7 C)  TempSrc: Oral  Oral Oral  SpO2: 100%  98% 97%  Weight:  76.1 kg    Height:        Wt Readings from Last 3 Encounters:  03/31/20 76.1 kg     Intake/Output Summary (Last 24 hours) at 03/31/2020 1221 Last data filed at 03/31/2020 1019 Gross per 24 hour  Intake 639.09 ml  Output --  Net 639.09 ml     Physical Exam  Awake Alert, No new F.N deficits, Normal affect [place].AT,PERRAL Supple Neck,No JVD, No cervical lymphadenopathy appriciated.  Symmetrical Chest wall movement, Good air movement bilaterally, CTAB RRR,No Gallops, Rubs or new Murmurs, No Parasternal Heave +ve B.Sounds, Abd Soft,  No tenderness, No organomegaly appriciated, No rebound - guarding or rigidity. No Cyanosis, Clubbing or edema, No new Rash or bruise    Data Review:    CBC Recent Labs  Lab 03/28/20 1121 03/29/20 0302 03/31/20 0303  WBC 6.8 3.0* 2.3*  HGB 13.9 12.2 10.8*  HCT 44.4 37.6 34.7*  PLT 173 144* 119*  MCV 83.8 82.1 83.0  MCH 26.2 26.6 25.8*  MCHC 31.3 32.4 31.1  RDW 12.1 12.1 12.2  LYMPHSABS 2.3 0.4*  --   MONOABS 0.8 0.2  --   EOSABS 0.0 0.0  --   BASOSABS 0.0 0.0  --     Chemistries  Recent Labs  Lab 03/28/20 1121 03/28/20 2003 03/29/20 0302 03/30/20 0639 03/31/20 0303  NA 133* 136 140 143 142  K 3.4* 3.9 4.1 4.2 3.9  CL 102 105 109 112* 109  CO2 15* 21* 21* 25 25  GLUCOSE 108* 109* 120* 120* 134*  BUN 23* 16 13 13 12   CREATININE 1.87* 1.16* 1.03* 0.89 0.81  CALCIUM 9.0 8.8* 9.0 8.5* 8.4*  MG  --  1.8 2.0  --  2.2  AST 84*  --  63* 56* 74*  ALT 52*  --  42 33 34  ALKPHOS 83  --  68 48 46  BILITOT 1.5*  --  1.0 0.5 0.7   ------------------------------------------------------------------------------------------------------------------ No results for input(s): CHOL, HDL, LDLCALC, TRIG, CHOLHDL, LDLDIRECT in the last 72 hours.  Lab Results  Component Value Date   HGBA1C 5.6 03/28/2020   ------------------------------------------------------------------------------------------------------------------ Recent Labs    03/28/20 1630  TSH 1.482   ------------------------------------------------------------------------------------------------------------------ Recent Labs    03/30/20 0639 03/31/20 0303  FERRITIN 441* 499*    Coagulation profile Recent Labs  Lab 03/28/20 1121  INR 1.1    Recent Labs    03/30/20 0639 03/31/20 0303  DDIMER 0.43 0.61*    Cardiac Enzymes No results for input(s): CKMB, TROPONINI, MYOGLOBIN in the last 168 hours.  Invalid input(s):  CK ------------------------------------------------------------------------------------------------------------------ No results found for: BNP  Micro Results Recent Results (from the past 240 hour(s))  Blood Culture (routine x 2)     Status: None (Preliminary result)   Collection Time: 03/28/20 11:21 AM   Specimen: BLOOD  Result Value Ref Range Status   Specimen Description BLOOD RIGHT ANTECUBITAL  Final   Special Requests   Final    BOTTLES DRAWN AEROBIC AND ANAEROBIC Blood Culture adequate volume   Culture   Final    NO GROWTH 2 DAYS Performed at [facility], [address] [place], [place] [address]    Report Status PENDING  Incomplete  Blood Culture (routine x 2)     Status: None (Preliminary result)   Collection Time: 03/28/20 11:21 AM   Specimen: BLOOD  Result Value Ref Range Status   Specimen Description BLOOD RIGHT ANTECUBITAL  Final   Special Requests   Final    BOTTLES DRAWN AEROBIC AND ANAEROBIC Blood Culture results may not be optimal due to an inadequate volume of blood received in culture bottles   Culture   Final    NO GROWTH 2 DAYS Performed at [facility], [place] [address] [place], [place] [address]    Report Status PENDING  Incomplete  Urine culture     Status: None   Collection Time: 03/28/20 11:43 AM   Specimen: In/Out Cath Urine  Result Value Ref Range Status   Specimen Description IN/OUT CATH URINE  Final   Special Requests NONE  Final   Culture   Final    NO GROWTH Performed at [facility], [place] [address] [place], [place] [address]    Report Status 03/29/2020 FINAL  Final  Respiratory Panel by RT PCR (Flu A&B, Covid) - Nasopharyngeal Swab     Status: Abnormal   Collection Time: 03/28/20 11:43 AM   Specimen: Nasopharyngeal Swab  Result  Value Ref Range Status   SARS Coronavirus 2 by RT PCR POSITIVE (A) NEGATIVE Final    Comment: RESULT CALLED TO, READ BACK BY AND VERIFIED WITH: Chalmers Cater RN 13:20 03/28/20  ([email or web address]) (NOTE) SARS-CoV-2 target nucleic acids are DETECTED. SARS-CoV-2 RNA is generally detectable in upper respiratory specimens  during the acute phase of infection. Positive results are indicative of the presence of the identified virus, but do not rule out bacterial infection or co-infection with other pathogens not detected by the test. Clinical correlation with Kristen Williamson history and other diagnostic information is necessary to determine Kristen Williamson infection status. The expected result is Negative. Fact Sheet for Patients:  PinkCheek.be Fact Sheet for Healthcare Providers: GravelBags.it This test is not yet approved or cleared by the [place] FDA and  has been authorized for detection and/or diagnosis of SARS-CoV-2 by FDA under an Emergency Use Authorization (EUA).  This EUA will remain in effect (meaning this test can be used)  for the duration of  the COVID-19 declaration under Section 564(b)(1) of the Act, 21 U.S.C. section 360bbb-3(b)(1), unless the authorization is terminated or revoked sooner.    Influenza A by PCR NEGATIVE NEGATIVE Final   Influenza B by PCR NEGATIVE NEGATIVE Final    Comment: (NOTE) The Xpert Xpress SARS-CoV-2/FLU/RSV assay is intended as an aid in  the diagnosis of influenza from Nasopharyngeal swab specimens and  should not be used as a sole basis for treatment. Nasal washings and  aspirates are unacceptable for Xpert Xpress SARS-CoV-2/FLU/RSV  testing. Fact Sheet for Patients: PinkCheek.be Fact Sheet for Healthcare Providers: GravelBags.it This test is not yet approved or cleared by the [place] FDA and  has been authorized for detection and/or diagnosis of SARS-CoV-2 by  FDA under an Emergency Use Authorization (EUA). This EUA will remain  in effect (meaning this test can be used) for the duration of the  Covid-19 declaration  under Section 564(b)(1) of the Act, 21  U.S.C. section 360bbb-3(b)(1), unless the authorization is  terminated or revoked. Performed at [facility], [place] [address], [place], [place] [address]   MRSA PCR Screening     Status: None   Collection Time: 03/28/20  8:05 PM   Specimen: Nasal Mucosa; Nasopharyngeal  Result Value Ref Range Status   MRSA by PCR NEGATIVE NEGATIVE Final    Comment:        The GeneXpert MRSA Assay (FDA approved for NASAL specimens only), is one component of a comprehensive MRSA colonization surveillance program. It is not intended to diagnose MRSA infection nor to guide or monitor treatment for MRSA infections. Performed at [facility], [place] [address] [place], [place] [address]     Radiology Reports CT Head Wo Contrast  Result Date: 03/28/2020 CLINICAL DATA:  44 year old female with altered mental status. Positive COVID-19. EXAM: CT HEAD WITHOUT CONTRAST TECHNIQUE: Contiguous axial images were obtained from the base of the skull through the vertex without intravenous contrast. COMPARISON:  None. FINDINGS: Brain: The ventricles and sulci appropriate in size for Kristen Williamson's age. The gray-white matter discrimination is preserved. There is no acute intracranial hemorrhage. No midline shift. 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 There is loss of continuity of the anterior sella with extension of the sellar tissue into the sphenoid sinuses. Dedicated pituitary MRI without and with contrast is recommended for better evaluation. Vascular: No hyperdense vessel or unexpected calcification. Skull: Normal. Negative for fracture or focal lesion.  Sinuses/Orbits: There is mild mucoperiosteal thickening of paranasal sinuses. Partial opacification of the sphenoid sinuses. There is a 1.5 cm right maxillary sinus retention cyst or polyp. The right mastoid air cells are clear. Left mastoid effusions noted. Other: None IMPRESSION:  1. No acute intracranial hemorrhage. 2. Heterogeneous sellar mass as above. Dedicated pituitary MRI without and with contrast is recommended for better evaluation. 3. Left mastoid effusions. Electronically Signed   By: Anner Crete M.D.   On: 03/28/2020 17:01   DG Chest Port 1 View  Result Date: 03/28/2020 CLINICAL DATA:  Pt reports generalized weakness and fatigue for the past week, not eating or drinking much due to nausea. 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 Pt ill appearing in triage EXAM: PORTABLE CHEST 1 VIEW COMPARISON:  None. FINDINGS: Normal heart, mediastinum and hila. Lungs are clear.  No pleural effusion or pneumothorax. Skeletal structures are grossly intact. IMPRESSION: No active disease. Electronically Signed   By: Lajean Manes M.D.   On: 03/28/2020 12:00

## 2020-03-31 NOTE — Plan of Care (Signed)
  Problem: Clinical Measurements: Goal: Respiratory complications will improve Outcome: Progressing   

## 2020-03-31 NOTE — Plan of Care (Signed)
  Problem: Clinical Measurements: Goal: Ability to maintain clinical measurements within normal limits will improve Outcome: Progressing   Problem: Clinical Measurements: Goal: Respiratory complications will improve Outcome: Progressing   Problem: Clinical Measurements: Goal: Cardiovascular complication will be avoided Outcome: Progressing   

## 2020-03-31 NOTE — Progress Notes (Signed)
Progress Note  Patient Name: Kristen Williamson Date of Encounter: 03/31/2020  Primary Cardiologist: No primary care provider on file. [place]  Subjective   Feels better, no syncope, no CP  Inpatient Medications    Scheduled Meds: . chlorhexidine  15 mL Mouth Rinse BID  . Chlorhexidine Gluconate Cloth  6 each Topical Q0600  . enoxaparin (LOVENOX) injection  40 mg Subcutaneous Q24H  . famotidine  20 mg Oral Daily  . mouth rinse  15 mL Mouth Rinse q12n4p  . predniSONE  10 mg Oral Q breakfast   Continuous Infusions: . sodium chloride Stopped (03/28/20 1813)  . remdesivir 100 mg in NS 100 mL 100 mg (03/31/20 1019)   PRN Meds: acetaminophen, docusate sodium, loperamide, ondansetron (ZOFRAN) IV, polyethylene glycol   Vital Signs    Vitals:   03/31/20 0014 03/31/20 0327 03/31/20 0453 03/31/20 1015  BP: (!) 143/84  (!) 157/81 (!) 150/94  Pulse: (!) 46  (!) 41 (!) 55  Resp: 18  18 18   Temp: 98.6 F (37 C)  98.4 F (36.9 C) 98 F (36.7 C)  TempSrc: Oral  Oral Oral  SpO2: 100%  98% 97%  Weight:  76.1 kg    Height:        Intake/Output Summary (Last 24 hours) at 03/31/2020 1228 Last data filed at 03/31/2020 1019 Gross per 24 hour  Intake 639.09 ml  Output -  Net 639.09 ml   Last 3 Weights 03/31/2020 03/30/2020 03/29/2020  Weight (lbs) 167 lb 12.8 oz 172 lb 2.9 oz 172 lb 2.9 oz  Weight (kg) 76.114 kg 78.1 kg 78.1 kg      Telemetry    S brady 40-50's. Now 22's - Personally Reviewed  ECG    46 SB - Personally Reviewed  Physical Exam   GEN: No acute distress.   Neck: No JVD Cardiac: RRR, no murmurs, rubs, or gallops.  Respiratory: Clear to auscultation bilaterally. GI: Soft, nontender, non-distended  MS: No edema; No deformity. Neuro:  Nonfocal  Psych: Normal affect   Labs    High Sensitivity Troponin:  No results for input(s): TROPONINIHS in the last 720 hours.    Chemistry Recent Labs  Lab 03/29/20 0302 03/30/20 0639 03/31/20 0303  NA 140 143 142  K  4.1 4.2 3.9  CL 109 112* 109  CO2 21* 25 25  GLUCOSE 120* 120* 134*  BUN 13 13 12   CREATININE 1.03* 0.89 0.81  CALCIUM 9.0 8.5* 8.4*  PROT 6.8 5.7* 6.1*  ALBUMIN 3.5 3.0* 3.3*  AST 63* 56* 74*  ALT 42 33 34  ALKPHOS 68 48 46  BILITOT 1.0 0.5 0.7  GFRNONAA >60 >60 >60  GFRAA >60 >60 >60  ANIONGAP 10 6 8      Hematology Recent Labs  Lab 03/28/20 1121 03/29/20 0302 03/31/20 0303  WBC 6.8 3.0* 2.3*  RBC 5.30* 4.58 4.18  HGB 13.9 12.2 10.8*  HCT 44.4 37.6 34.7*  MCV 83.8 82.1 83.0  MCH 26.2 26.6 25.8*  MCHC 31.3 32.4 31.1  RDW 12.1 12.1 12.2  PLT 173 144* 119*    BNPNo results for input(s): BNP, PROBNP in the last 168 hours.   DDimer  Recent Labs  Lab 03/29/20 0302 03/30/20 0639 03/31/20 0303  DDIMER 1.09* 0.43 0.61*     Radiology    No results found.  Cardiac Studies   ECHO prelim - normal EF  Patient Profile     44 y.o. female with COVID, transient bradycardia, ECHO with normal EF  Assessment & Plan    Bradycardia  - asymptomatic, improving.   - EF normal on ECHO  - TSH normal  - Reassuring.    COVID  - weakness, dehydration prior. Improved  No indication for pacer.   Will sign off.  Avoid AV blocking agents.       For questions or updates, please contact [place] Please consult [email or web address] for contact info under        Signed, Candee Furbish, MD  03/31/2020, 12:28 PM

## 2020-03-31 NOTE — Progress Notes (Signed)
Pt ambulated in hallway.  Pt's o2 before ambulation was 98% on room air.  Pt's o2 during ambulation was 90%.  Pt was returned to room after ambulation.  Pt's o2 post ambulation was 95%.

## 2020-04-01 DIAGNOSIS — U071 COVID-19: Secondary | ICD-10-CM | POA: Diagnosis not present

## 2020-04-01 DIAGNOSIS — R579 Shock, unspecified: Secondary | ICD-10-CM | POA: Diagnosis not present

## 2020-04-01 DIAGNOSIS — I959 Hypotension, unspecified: Secondary | ICD-10-CM | POA: Diagnosis not present

## 2020-04-01 LAB — COMPREHENSIVE METABOLIC PANEL
ALT: 38 U/L (ref 0–44)
AST: 71 U/L — ABNORMAL HIGH (ref 15–41)
Albumin: 3.2 g/dL — ABNORMAL LOW (ref 3.5–5.0)
Alkaline Phosphatase: 49 U/L (ref 38–126)
Anion gap: 8 (ref 5–15)
BUN: 9 mg/dL (ref 6–20)
CO2: 25 mmol/L (ref 22–32)
Calcium: 8.4 mg/dL — ABNORMAL LOW (ref 8.9–10.3)
Chloride: 106 mmol/L (ref 98–111)
Creatinine, Ser: 0.87 mg/dL (ref 0.44–1.00)
GFR calc Af Amer: >60 mL/min (ref >60)
GFR calc non Af Amer: >60 mL/min (ref >60)
Glucose, Bld: 115 mg/dL — ABNORMAL HIGH (ref 70–99)
Potassium: 3.3 mmol/L — ABNORMAL LOW (ref 3.5–5.1)
Sodium: 139 mmol/L (ref 135–145)
Total Bilirubin: 0.7 mg/dL (ref 0.3–1.2)
Total Protein: 6 g/dL — ABNORMAL LOW (ref 6.5–8.1)

## 2020-04-01 LAB — CBC
HCT: 34.8 % — ABNORMAL LOW (ref 36.0–46.0)
Hemoglobin: 11.3 g/dL — ABNORMAL LOW (ref 12.0–15.0)
MCH: 26.8 pg (ref 26.0–34.0)
MCHC: 32.5 g/dL (ref 30.0–36.0)
MCV: 82.5 fL (ref 80.0–100.0)
Platelets: 141 10*3/uL — ABNORMAL LOW (ref 150–400)
RBC: 4.22 MIL/uL (ref 3.87–5.11)
RDW: 11.9 % (ref 11.5–15.5)
WBC: 2.8 10*3/uL — ABNORMAL LOW (ref 4.0–10.5)
nRBC: 0 % (ref 0.0–0.2)

## 2020-04-01 MED ORDER — PREDNISONE 5 MG PO TABS
2.5000 mg | ORAL_TABLET | Freq: Every day | ORAL | Status: DC
  Administered 2020-04-02: 2.5 mg via ORAL
  Filled 2020-04-01: qty 1

## 2020-04-01 MED ORDER — POTASSIUM CHLORIDE CRYS ER 20 MEQ PO TBCR
40.0000 meq | EXTENDED_RELEASE_TABLET | Freq: Once | ORAL | Status: AC
  Administered 2020-04-01: 11:00:00 40 meq via ORAL
  Filled 2020-04-01: qty 2

## 2020-04-01 MED ORDER — BLISTEX MEDICATED EX OINT
TOPICAL_OINTMENT | CUTANEOUS | Status: DC | PRN
  Filled 2020-04-01: qty 6.3

## 2020-04-01 NOTE — Progress Notes (Signed)
PROGRESS NOTE                                                                                                                                                                                                             Patient Demographics:    Kristen Williamson, is a 44 y.o. female, DOB - 1976/04/28, UV:[id number]  Outpatient Primary MD for the patient is Patient, No Pcp Per   Admit date - 03/28/2020   LOS - 4  Chief Complaint  Patient presents with  . Weakness  . Fever       Brief Narrative: Patient is a 44 y.o. female with PMHx of pituitary tumor-who apparently is being scheduled for radiation/resection shortly-presented to the hospital on 4/21 with nausea, vomiting and diarrhea-found to have COVID-19.  She was hypotensive, tachycardic-and subsequently admitted by PCCM to the ICU-she was thought to have COVID-19 gastroenteritis and adrenal insufficiency causing hypotension.  Significant Events: 4/21>> admit to [facility] ICU-hypotensive-history of nausea/vomiting/diarrhea-COVID-19 positive 4/23>> bradycardia overnight-heart rate sustaining in the 40s/50s-briefly dipped down to the low 30s. 4/23>> transferred to [facility]  COVID-19 medications: Steroids: 4/21>> Remdesivir: 4/21>>  Antibiotics: Aztreonam: 4/21>> 4/22 Vancomycin: 4/21 x 1  Microbiology data: Blood culture: 4/21>> negative Urine culture: 4/21>> negative  Consults: PCCM, Cards    Subjective:   Patient in bed, appears comfortable, denies any headache, no fever, no chest pain or pressure, no shortness of breath , no abdominal pain. No focal weakness.    Assessment  & Plan :   Hypotension secondary to adrenal insufficiency related to pituitary tumor along with some element of dehydration from gastroenteritis : BP now stable-remains on tapering steroids.  Random cortisol on 4/21 was 7.3-which seems inappropriately low in relation to patient's clinical  situation.  Patient/mother aware that patient will need outpatient endocrinology follow-up.  On oral prednisone will continue which is being rapidly tapered after adequate IV fluids for hydration, blood pressure holding well, will require outpatient endocrine follow-up.  Will advance activity and monitor response.  Sinus bradycardia: Heart rate dipped down to the low 30s-recent TSH within normal limits-patient asymptomatic-seen by cardiology and echo stable, heart rate is gradually improving, blood pressure holding GERD and currently asymptomatic, monitor on the 24 hours if okay discharge home on 04/02/2020.  COVID-19 infection with gastroenteritis: Chest x-ray without any infection-no respiratory symptoms-has already been started on  remdesivir-this will be continued for total of 5 days.     Recent Labs  Lab 03/28/20 1121 03/28/20 1121 03/28/20 1630 03/28/20 1722 03/28/20 2003 03/29/20 0302 03/30/20 0639 03/31/20 0303 04/01/20 0859  NA 133*   < >  --   --  136 140 143 142 139  K 3.4*   < >  --   --  3.9 4.1 4.2 3.9 3.3*  CL 102   < >  --   --  105 109 112* 109 106  CO2 15*   < >  --   --  21* 21* 25 25 25   GLUCOSE 108*   < >  --   --  109* 120* 120* 134* 115*  BUN 23*   < >  --   --  16 13 13 12 9   CREATININE 1.87*   < >  --   --  1.16* 1.03* 0.89 0.81 0.87  CALCIUM 9.0   < >  --   --  8.8* 9.0 8.5* 8.4* 8.4*  AST 84*  --   --   --   --  63* 56* 74* 71*  ALT 52*  --   --   --   --  42 33 34 38  ALKPHOS 83  --   --   --   --  68 48 46 49  BILITOT 1.5*  --   --   --   --  1.0 0.5 0.7 0.7  ALBUMIN 4.2  --   --   --   --  3.5 3.0* 3.3* 3.2*  MG  --   --   --   --  1.8 2.0  --  2.2  --   CRP  --   --   --   --   --   --   --  2.2*  --   DDIMER  --   --   --   --   --  1.09* 0.43 0.61*  --   INR 1.1  --   --   --   --   --   --   --   --   TSH  --   --  1.482  --   --   --   --   --   --   HGBA1C  --   --   --  5.6  --   --   --   --   --    < > = values in this interval not displayed.      AKI: Hemodynamically mediated-resolved  Anion gap metabolic acidosis: Secondary to AKI-resolved  Transaminitis: Mild-likely secondary to COVID-19-stable for further follow-up without any further work-up  History of pituitary tumor: Followed by Dr. Christella Noa (per patient)- Per patient plans are underway for surgical resection/radiation.  LH, FSH-on the lower side-further work-up/treatment will be deferred to the outpatient setting-when patient follows with endocrinology.     Condition - Stable  Family Communication  : Mother Thayer Headings [phone] on 04/01/2020 message left at 9:59 AM  Procedures:  TTE  1. Left ventricular ejection fraction, by estimation, is 60 to 65%. The left ventricle has normal function. The left ventricle has no regional wall motion abnormalities. Left ventricular diastolic parameters were normal.  2. Right ventricular systolic function is normal. The right ventricular size is normal.  3. The mitral valve is myxomatous. Trivial mitral valve regurgitation. No evidence of mitral stenosis.  4. The aortic valve is  normal in structure. Aortic valve regurgitation is not visualized. No aortic stenosis is present.  5. The inferior vena cava is dilated in size with <50% respiratory variability, suggesting right atrial pressure of 15 mmHg.    Code Status :  Full Code  Diet :  Diet Order            Diet regular Room service appropriate? Yes; Fluid consistency: Thin  Diet effective now               Disposition Plan  : Stay inpatient in the hospital to complete the course of IV remdesivir for COVID-19 pneumonia, also has severe adrenal insufficiency requiring medications and work-up.  Once stable discharge home.   Antimicorbials  :    Anti-infectives (From admission, onward)   Start     Dose/Rate Route Frequency Ordered Stop   03/30/20 0800  vancomycin (VANCOREADY) IVPB 1750 mg/350 mL  Status:  Discontinued     1,750 mg 175 mL/hr over 120 Minutes  Intravenous Every 48 hours 03/28/20 1233 03/29/20 0806   03/29/20 1800  aztreonam (AZACTAM) 2 g in sodium chloride 0.9 % 100 mL IVPB  Status:  Discontinued     2 g 200 mL/hr over 30 Minutes Intravenous Every 8 hours 03/29/20 1044 03/29/20 1044   03/29/20 1000  remdesivir 100 mg in sodium chloride 0.9 % 100 mL IVPB     100 mg 200 mL/hr over 30 Minutes Intravenous Daily 03/28/20 1704 04/01/20 0922   03/29/20 1000  vancomycin (VANCOCIN) IVPB 1000 mg/200 mL premix  Status:  Discontinued     1,000 mg 200 mL/hr over 60 Minutes Intravenous Every 12 hours 03/29/20 0806 03/29/20 1043   03/28/20 2000  aztreonam (AZACTAM) 1 g in sodium chloride 0.9 % 100 mL IVPB  Status:  Discontinued     1 g 200 mL/hr over 30 Minutes Intravenous Every 8 hours 03/28/20 1233 03/29/20 1043   03/28/20 1715  remdesivir 200 mg in sodium chloride 0.9% 250 mL IVPB     200 mg 580 mL/hr over 30 Minutes Intravenous Once 03/28/20 1704 03/28/20 1902   03/28/20 1130  aztreonam (AZACTAM) 2 g in sodium chloride 0.9 % 100 mL IVPB     2 g 200 mL/hr over 30 Minutes Intravenous  Once 03/28/20 1109 03/28/20 1238   03/28/20 1115  metroNIDAZOLE (FLAGYL) IVPB 500 mg     500 mg 100 mL/hr over 60 Minutes Intravenous  Once 03/28/20 1109 03/28/20 1340   03/28/20 1115  vancomycin (VANCOCIN) IVPB 1000 mg/200 mL premix     1,000 mg 200 mL/hr over 60 Minutes Intravenous  Once 03/28/20 1109 03/28/20 1446     DVT prophylaxis: SQ Lovenox  Inpatient Medications  Scheduled Meds: . chlorhexidine  15 mL Mouth Rinse BID  . Chlorhexidine Gluconate Cloth  6 each Topical Q0600  . enoxaparin (LOVENOX) injection  40 mg Subcutaneous Q24H  . famotidine  20 mg Oral Daily  . mouth rinse  15 mL Mouth Rinse q12n4p  . predniSONE  10 mg Oral Q breakfast   Continuous Infusions: . sodium chloride Stopped (03/28/20 1813)   PRN Meds:.acetaminophen, docusate sodium, loperamide, ondansetron (ZOFRAN) IV, polyethylene glycol   Time Spent in minutes  25  See  all Orders from today for further details   Lala Lund M.D on 04/01/2020 at 9:54 AM  To page go to [email or web address] - use universal password  [facility] -  Office  [phone]    Objective:   Vitals:   03/31/20  1015 03/31/20 1718 03/31/20 2023 04/01/20 0450  BP: (!) 150/94 (!) 151/91 114/76 125/76  Pulse: (!) 55 (!) 52 (!) 56 (!) 55  Resp: 18 18 18 18   Temp: 98 F (36.7 C) 98 F (36.7 C) 98.6 F (37 C) 98.7 F (37.1 C)  TempSrc: Oral Oral Oral Oral  SpO2: 97% 97% 97% 96%  Weight:    76.5 kg  Height:        Wt Readings from Last 3 Encounters:  04/01/20 76.5 kg     Intake/Output Summary (Last 24 hours) at 04/01/2020 0954 Last data filed at 03/31/2020 1019 Gross per 24 hour  Intake 266.47 ml  Output --  Net 266.47 ml     Physical Exam  Awake Alert, No new F.N deficits, Normal affect [place].AT,PERRAL Supple Neck,No JVD, No cervical lymphadenopathy appriciated.  Symmetrical Chest wall movement, Good air movement bilaterally, CTAB RRR,No Gallops, Rubs or new Murmurs, No Parasternal Heave +ve B.Sounds, Abd Soft, No tenderness, No organomegaly appriciated, No rebound - guarding or rigidity. No Cyanosis, Clubbing or edema, No new Rash or bruise   Data Review:    CBC Recent Labs  Lab 03/28/20 1121 03/29/20 0302 03/31/20 0303 04/01/20 0859  WBC 6.8 3.0* 2.3* 2.8*  HGB 13.9 12.2 10.8* 11.3*  HCT 44.4 37.6 34.7* 34.8*  PLT 173 144* 119* 141*  MCV 83.8 82.1 83.0 82.5  MCH 26.2 26.6 25.8* 26.8  MCHC 31.3 32.4 31.1 32.5  RDW 12.1 12.1 12.2 11.9  LYMPHSABS 2.3 0.4*  --   --   MONOABS 0.8 0.2  --   --   EOSABS 0.0 0.0  --   --   BASOSABS 0.0 0.0  --   --     Chemistries  Recent Labs  Lab 03/28/20 1121 03/28/20 1121 03/28/20 2003 03/29/20 0302 03/30/20 0639 03/31/20 0303 04/01/20 0859  NA 133*   < > 136 140 143 142 139  K 3.4*   < > 3.9 4.1 4.2 3.9 3.3*  CL 102   < > 105 109 112* 109 106  CO2 15*   < > 21* 21* 25 25 25   GLUCOSE 108*   < > 109*  120* 120* 134* 115*  BUN 23*   < > 16 13 13 12 9   CREATININE 1.87*   < > 1.16* 1.03* 0.89 0.81 0.87  CALCIUM 9.0   < > 8.8* 9.0 8.5* 8.4* 8.4*  MG  --   --  1.8 2.0  --  2.2  --   AST 84*  --   --  63* 56* 74* 71*  ALT 52*  --   --  42 33 34 38  ALKPHOS 83  --   --  68 48 46 49  BILITOT 1.5*  --   --  1.0 0.5 0.7 0.7   < > = values in this interval not displayed.   ------------------------------------------------------------------------------------------------------------------ No results for input(s): CHOL, HDL, LDLCALC, TRIG, CHOLHDL, LDLDIRECT in the last 72 hours.  Lab Results  Component Value Date   HGBA1C 5.6 03/28/2020   ------------------------------------------------------------------------------------------------------------------ No results for input(s): TSH, T4TOTAL, T3FREE, THYROIDAB in the last 72 hours.  Invalid input(s): FREET3 ------------------------------------------------------------------------------------------------------------------ Recent Labs    03/30/20 0639 03/31/20 0303  FERRITIN 441* 499*    Coagulation profile Recent Labs  Lab 03/28/20 1121  INR 1.1    Recent Labs    03/30/20 0639 03/31/20 0303  DDIMER 0.43 0.61*    Cardiac Enzymes No results for input(s): CKMB, TROPONINI,  MYOGLOBIN in the last 168 hours.  Invalid input(s): CK ------------------------------------------------------------------------------------------------------------------ No results found for: BNP  Micro Results Recent Results (from the past 240 hour(s))  Blood Culture (routine x 2)     Status: None (Preliminary result)   Collection Time: 03/28/20 11:21 AM   Specimen: BLOOD  Result Value Ref Range Status   Specimen Description BLOOD RIGHT ANTECUBITAL  Final   Special Requests   Final    BOTTLES DRAWN AEROBIC AND ANAEROBIC Blood Culture adequate volume   Culture   Final    NO GROWTH 3 DAYS Performed at [facility], [address] [place], [place]  [address]    Report Status PENDING  Incomplete  Blood Culture (routine x 2)     Status: None (Preliminary result)   Collection Time: 03/28/20 11:21 AM   Specimen: BLOOD  Result Value Ref Range Status   Specimen Description BLOOD RIGHT ANTECUBITAL  Final   Special Requests   Final    BOTTLES DRAWN AEROBIC AND ANAEROBIC Blood Culture results may not be optimal due to an inadequate volume of blood received in culture bottles   Culture   Final    NO GROWTH 3 DAYS Performed at [facility], [place] [address], [place], [place] [address]    Report Status PENDING  Incomplete  Urine culture     Status: None   Collection Time: 03/28/20 11:43 AM   Specimen: In/Out Cath Urine  Result Value Ref Range Status   Specimen Description IN/OUT CATH URINE  Final   Special Requests NONE  Final   Culture   Final    NO GROWTH Performed at [facility], [place] [address] [place], [place] [address]    Report Status 03/29/2020 FINAL  Final  Respiratory Panel by RT PCR (Flu A&B, Covid) - Nasopharyngeal Swab     Status: Abnormal   Collection Time: 03/28/20 11:43 AM   Specimen: Nasopharyngeal Swab  Result Value Ref Range Status   SARS Coronavirus 2 by RT PCR POSITIVE (A) NEGATIVE Final    Comment: RESULT CALLED TO, READ BACK BY AND VERIFIED WITH: Chalmers Cater RN 13:20 03/28/20 ([email or web address]) (NOTE) SARS-CoV-2 target nucleic acids are DETECTED. SARS-CoV-2 RNA is generally detectable in upper respiratory specimens  during the acute phase of infection. Positive results are indicative of the presence of the identified virus, but do not rule out bacterial infection or co-infection with other pathogens not detected by the test. Clinical correlation with patient history and other diagnostic information is necessary to determine patient infection status. The expected result is Negative. Fact Sheet for Patients:  PinkCheek.be Fact Sheet for Healthcare  Providers: GravelBags.it This test is not yet approved or cleared by the [place] FDA and  has been authorized for detection and/or diagnosis of SARS-CoV-2 by FDA under an Emergency Use Authorization (EUA).  This EUA will remain in effect (meaning this test can be used)  for the duration of  the COVID-19 declaration under Section 564(b)(1) of the Act, 21 U.S.C. section 360bbb-3(b)(1), unless the authorization is terminated or revoked sooner.    Influenza A by PCR NEGATIVE NEGATIVE Final   Influenza B by PCR NEGATIVE NEGATIVE Final    Comment: (NOTE) The Xpert Xpress SARS-CoV-2/FLU/RSV assay is intended as an aid in  the diagnosis of influenza from Nasopharyngeal swab specimens and  should not be used as a sole basis for treatment. Nasal washings and  aspirates are unacceptable for Xpert Xpress SARS-CoV-2/FLU/RSV  testing. Fact Sheet for  Patients: PinkCheek.be Fact Sheet for Healthcare Providers: GravelBags.it This test is not yet approved or cleared by the [place] FDA and  has been authorized for detection and/or diagnosis of SARS-CoV-2 by  FDA under an Emergency Use Authorization (EUA). This EUA will remain  in effect (meaning this test can be used) for the duration of the  Covid-19 declaration under Section 564(b)(1) of the Act, 21  U.S.C. section 360bbb-3(b)(1), unless the authorization is  terminated or revoked. Performed at [facility], [place] [address] [place], [place] [address]   MRSA PCR Screening     Status: None   Collection Time: 03/28/20  8:05 PM   Specimen: Nasal Mucosa; Nasopharyngeal  Result Value Ref Range Status   MRSA by PCR NEGATIVE NEGATIVE Final    Comment:        The GeneXpert MRSA Assay (FDA approved for NASAL specimens only), is one component of a comprehensive MRSA colonization surveillance program. It is not intended to diagnose MRSA infection nor  to guide or monitor treatment for MRSA infections. Performed at [facility], [place] [address], [place], [place] [address]     Radiology Reports CT Head Wo Contrast  Result Date: 03/28/2020 CLINICAL DATA:  44 year old female with altered mental status. Positive COVID-19. EXAM: CT HEAD WITHOUT CONTRAST TECHNIQUE: Contiguous axial images were obtained from the base of the skull through the vertex without intravenous contrast. COMPARISON:  None. FINDINGS: Brain: The ventricles and sulci appropriate in size for patient's age. The gray-white matter discrimination is preserved. There is no acute intracranial hemorrhage. No midline shift. 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 There is loss of continuity of the anterior sella with extension of the sellar tissue into the sphenoid sinuses. Dedicated pituitary MRI without and with contrast is recommended for better evaluation. Vascular: No hyperdense vessel or unexpected calcification. Skull: Normal. Negative for fracture or focal lesion. Sinuses/Orbits: There is mild mucoperiosteal thickening of paranasal sinuses. Partial opacification of the sphenoid sinuses. There is a 1.5 cm right maxillary sinus retention cyst or polyp. The right mastoid air cells are clear. Left mastoid effusions noted. Other: None IMPRESSION: 1. No acute intracranial hemorrhage. 2. Heterogeneous sellar mass as above. Dedicated pituitary MRI without and with contrast is recommended for better evaluation. 3. Left mastoid effusions. Electronically Signed   By: Anner Crete M.D.   On: 03/28/2020 17:01   DG Chest Port 1 View  Result Date: 03/28/2020 CLINICAL DATA:  Pt reports generalized weakness and fatigue for the past week, not eating or drinking much due to nausea. 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 Pt ill appearing in triage EXAM: PORTABLE CHEST 1 VIEW COMPARISON:  None. FINDINGS: Normal heart,  mediastinum and hila. Lungs are clear.  No pleural effusion or pneumothorax. Skeletal structures are grossly intact. IMPRESSION: No active disease. Electronically Signed   By: Lajean Manes M.D.   On: 03/28/2020 12:00   ECHOCARDIOGRAM COMPLETE  Result Date: 03/31/2020    ECHOCARDIOGRAM REPORT   Patient Name:   [facility] Date of Exam: 03/31/2020 Medical Rec #:  [id number]       Height:       70.0 in Accession #:    [id number]      Weight:       167.8 lb Date of Birth:  Jan 11, 1976      BSA:          1.937 m Patient  Age:    67 years        BP:           157/81 mmHg Patient Gender: F               HR:           45 bpm. Exam Location:  Inpatient Procedure: 2D Echo, Cardiac Doppler and Color Doppler Indications:    Abnormal ECG 794.31 / R94.31  History:        Patient has no prior history of Echocardiogram examinations.                 Risk Factors:Non-Smoker.  Sonographer:    Vickie Epley RDCS Referring Phys: [id number] [place]  Sonographer Comments: Covid positive. IMPRESSIONS  1. Left ventricular ejection fraction, by estimation, is 60 to 65%. The left ventricle has normal function. The left ventricle has no regional wall motion abnormalities. Left ventricular diastolic parameters were normal.  2. Right ventricular systolic function is normal. The right ventricular size is normal.  3. The mitral valve is myxomatous. Trivial mitral valve regurgitation. No evidence of mitral stenosis.  4. The aortic valve is normal in structure. Aortic valve regurgitation is not visualized. No aortic stenosis is present.  5. The inferior vena cava is dilated in size with <50% respiratory variability, suggesting right atrial pressure of 15 mmHg. FINDINGS  Left Ventricle: Left ventricular ejection fraction, by estimation, is 60 to 65%. The left ventricle has normal function. The left ventricle has no regional wall motion abnormalities. The left ventricular internal cavity size was normal in size. There is  no left  ventricular hypertrophy. Left ventricular diastolic parameters were normal. Right Ventricle: The right ventricular size is normal. No increase in right ventricular wall thickness. Right ventricular systolic function is normal. Left Atrium: Left atrial size was normal in size. Right Atrium: Right atrial size was normal in size. Pericardium: Trivial pericardial effusion is present. The pericardial effusion is posterior to the left ventricle. There is no evidence of cardiac tamponade. 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 Normal mobility of the mitral valve leaflets. Trivial mitral valve regurgitation. No evidence of mitral valve stenosis. Tricuspid Valve: The tricuspid valve is normal in structure. Tricuspid valve regurgitation is not demonstrated. No evidence of tricuspid stenosis. Aortic Valve: The aortic valve is normal in structure. Aortic valve regurgitation is not visualized. No aortic stenosis is present. Pulmonic Valve: The pulmonic valve was normal in structure. Pulmonic valve regurgitation is not visualized. No evidence of pulmonic stenosis. Aorta: The aortic root is normal in size and structure. Venous: The inferior vena cava is dilated in size with less than 50% respiratory variability, suggesting right atrial pressure of 15 mmHg. IAS/Shunts: No atrial level shunt detected by color flow Doppler.  LEFT VENTRICLE PLAX 2D LVIDd:         4.80 cm  Diastology LVIDs:         3.20 cm  LV e' lateral:   13.70 cm/s LV PW:         0.50 cm  LV E/e' lateral: 5.3 LV IVS:        0.50 cm  LV e' medial:    10.40 cm/s LVOT diam:     1.80 cm  LV E/e' medial:  7.0 LV SV:         59 LV SV Index:   30 LVOT Area:  2.54 cm  RIGHT VENTRICLE RV S prime:     10.30 cm/s TAPSE (M-mode): 2.0 cm LEFT ATRIUM           Index       RIGHT ATRIUM          Index LA diam:      3.30 cm 1.70 cm/m  RA Area:     9.91 cm LA Vol (A2C):  28.4 ml 14.66 ml/m RA Volume:   20.40 ml 10.53 ml/m LA Vol (A4C): 37.9 ml 19.56 ml/m  AORTIC VALVE LVOT Vmax:   93.50 cm/s LVOT Vmean:  60.500 cm/s LVOT VTI:    0.232 m  AORTA Ao Root diam: 2.60 cm MITRAL VALVE MV Area (PHT): 3.89 cm    SHUNTS MV Decel Time: 195 msec    Systemic VTI:  0.23 m MV E velocity: 72.90 cm/s  Systemic Diam: 1.80 cm MV A velocity: 23.80 cm/s MV E/A ratio:  3.06 Candee Furbish MD Electronically signed by Candee Furbish MD Signature Date/Time: 03/31/2020/2:05:46 PM    Final

## 2020-04-01 NOTE — Progress Notes (Signed)
   04/01/20 1137 04/01/20 1146  Vitals  BP 106/72 113/79  MAP (mmHg) 84 91  Pulse Rate (!) 53 (!) 53  ECG Heart Rate (!) 54 (!) 53    Pre walk and post walk vitals as above. Patient tolerated ambulating 250 feet in the hallway with no complaints.

## 2020-04-01 NOTE — Plan of Care (Signed)
  Problem: Clinical Measurements: Goal: Respiratory complications will improve Outcome: Progressing   

## 2020-04-02 DIAGNOSIS — U071 COVID-19: Secondary | ICD-10-CM | POA: Diagnosis not present

## 2020-04-02 LAB — COMPREHENSIVE METABOLIC PANEL
ALT: 39 U/L (ref 0–44)
AST: 58 U/L — ABNORMAL HIGH (ref 15–41)
Albumin: 3.5 g/dL (ref 3.5–5.0)
Alkaline Phosphatase: 53 U/L (ref 38–126)
Anion gap: 8 (ref 5–15)
BUN: 8 mg/dL (ref 6–20)
CO2: 28 mmol/L (ref 22–32)
Calcium: 8.6 mg/dL — ABNORMAL LOW (ref 8.9–10.3)
Chloride: 105 mmol/L (ref 98–111)
Creatinine, Ser: 0.86 mg/dL (ref 0.44–1.00)
GFR calc Af Amer: >60 mL/min (ref >60)
GFR calc non Af Amer: >60 mL/min (ref >60)
Glucose, Bld: 90 mg/dL (ref 70–99)
Potassium: 3.6 mmol/L (ref 3.5–5.1)
Sodium: 141 mmol/L (ref 135–145)
Total Bilirubin: 0.9 mg/dL (ref 0.3–1.2)
Total Protein: 6.2 g/dL — ABNORMAL LOW (ref 6.5–8.1)

## 2020-04-02 LAB — CULTURE, BLOOD (ROUTINE X 2)
Culture: NO GROWTH
Culture: NO GROWTH
Special Requests: ADEQUATE

## 2020-04-02 LAB — CBC
HCT: 36.5 % (ref 36.0–46.0)
Hemoglobin: 11.9 g/dL — ABNORMAL LOW (ref 12.0–15.0)
MCH: 26.9 pg (ref 26.0–34.0)
MCHC: 32.6 g/dL (ref 30.0–36.0)
MCV: 82.6 fL (ref 80.0–100.0)
Platelets: 168 10*3/uL (ref 150–400)
RBC: 4.42 MIL/uL (ref 3.87–5.11)
RDW: 11.8 % (ref 11.5–15.5)
WBC: 2.9 10*3/uL — ABNORMAL LOW (ref 4.0–10.5)
nRBC: 0 % (ref 0.0–0.2)

## 2020-04-02 MED ORDER — PREDNISONE 2.5 MG PO TABS: 2.5000 mg | ORAL_TABLET | Freq: Every day | ORAL | 0 refills | Status: DC

## 2020-04-02 NOTE — Discharge Summary (Signed)
Kristen Williamson [id number] DOB: 05/07/1976 DOA: 03/28/2020  PCP: Patient, No Pcp Per  Admit date: 03/28/2020  Discharge date: 04/02/2020  Admitted From: Home   Disposition:  Home   Recommendations for Outpatient Follow-up:   Follow up with PCP in 1-2 weeks  PCP Please obtain BMP/CBC, 2 view CXR in 1week,  (see Discharge instructions)   PCP Please follow up on the following pending results: Needs outpatient endocrine and cardiology follow-up.   Home Health: None Equipment/Devices: None  Consultations: PCCM Discharge Condition: Stable    CODE STATUS: Full    Diet Recommendation: Heart Healthy   Diet Order            Diet - low sodium heart healthy        Diet regular Room service appropriate? Yes; Fluid consistency: Thin  Diet effective now               Chief Complaint  Patient presents with  . Weakness  . Fever     Brief history of present illness from the day of admission and additional interim summary    Patient is a 44 y.o. female with PMHx of pituitary tumor-who apparently is being scheduled for radiation/resection shortly-presented to the hospital on 4/21 with nausea, vomiting and diarrhea-found to have COVID-19.  She was hypotensive, tachycardic-and subsequently admitted by PCCM to the ICU-she was thought to have COVID-19 gastroenteritis and adrenal insufficiency causing hypotension.  Significant Events: 4/21>> admit to [facility] ICU-hypotensive-history of nausea/vomiting/diarrhea-COVID-19 positive 4/23>> bradycardia overnight-heart rate sustaining in the 40s/50s-briefly dipped down to the low 30s. 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    Placed on high-dose steroids which have been tapered down to 2.5 mg, she has been adequately hydrated as well, have requested patient to follow with endocrine within a week to get definitive diagnosis and management of possible underlying adrenal insufficiency in the setting of pituitary tumor, previous MD had also explained this to patient's mother, currently symptom-free on 2.5 mg of prednisone with stable blood pressure, able to ambulate in the hallway without any assistance and symptoms, will be discharged home with PCP, outpatient endocrine follow-up.  Sinus bradycardia: Heart rate dipped down to the low 30s-recent TSH within normal limits-patient asymptomatic-seen by cardiology and echo stable, asymptomatic upon ambulation and activity with good chronotropic response,  will follow with cardiology outpatient 1 week post discharge, they will decide if patient will benefit from Holter monitor etc. or not, completely symptom-free at this time.  COVID-19 infection with gastroenteritis: Chest x-ray without any infection-no respiratory symptoms-was treated with course of remdesivir along with steroids.  AKI: Hemodynamically mediated-resolved  Anion gap metabolic acidosis: Secondary to AKI-resolved  Transaminitis: Mild-likely secondary to COVID-19-stable for further follow-up without any further work-up  History of pituitary tumor: Followed by Dr. Christella Noa (per patient)- Per patient plans are underway for surgical resection/radiation.  LH, FSH-on the lower side-further work-up/treatment will be deferred to the outpatient setting-when patient follows with endocrinology.    TTE  1. Left  ventricular ejection fraction, by estimation, is 60 to 65%. The left ventricle has normal function. The left ventricle has no regional wall motion abnormalities. Left ventricular diastolic parameters were normal.  2. Right ventricular systolic function is normal. The right ventricular size is normal.  3. The mitral valve is myxomatous. Trivial mitral valve regurgitation. No evidence of mitral stenosis.  4. The aortic valve is normal in structure. Aortic valve regurgitation is not visualized. No aortic stenosis is present.  5. The inferior vena cava is dilated in size with <50% respiratory variability, suggesting right atrial pressure of 15 mmHg.   Discharge diagnosis     Active Problems:   [facility])    Discharge instructions    Discharge Instructions    Diet - low sodium heart healthy   Complete by: As directed    Discharge instructions   Complete by: As directed    Follow with Primary MD in 7 days   Get CBC, CMP, 2 view Chest X ray -  checked next visit within 1 week by Primary MD   Activity: As tolerated with Full fall precautions use walker/cane & assistance as needed  Disposition Home    Diet: Heart Healthy    Special Instructions: If you have smoked or chewed Tobacco  in the last 2 yrs please stop smoking, stop any regular Alcohol  and or any Recreational drug use.  On your next visit with your primary care physician please Get Medicines reviewed and adjusted.  Please request your Prim.MD to go over all Hospital Tests and Procedure/Radiological results at the follow up, please get all Hospital records sent to your Prim MD by signing hospital release before you go home.  If you experience worsening of your admission symptoms, develop shortness of breath, life threatening emergency, suicidal or homicidal thoughts you must seek medical attention immediately by calling 911 or calling your MD immediately  if symptoms less severe.  You Must read complete  instructions/literature along with all the possible adverse reactions/side effects for all the Medicines you take and that have been prescribed to you. Take any new Medicines after you have completely understood and accpet all the possible adverse reactions/side effects.   Increase activity slowly   Complete by: As directed       Discharge Medications   Allergies as of 04/02/2020      Reactions   Penicillins Other (See Comments)   Reaction not recalled by the patient      Medication List    STOP taking these medications   Acidophilus Probiotic 10 MG Tabs   clindamycin 150 MG capsule Commonly known as: CLEOCIN   lidocaine 2 % solution Commonly known as: XYLOCAINE   nitrofurantoin (macrocrystal-monohydrate) 100 MG capsule Commonly known as: MACROBID   pseudoephedrine 60 MG tablet Commonly known as: SUDAFED  TAKE these medications   predniSONE 2.5 MG tablet Commonly known as: DELTASONE Take 1 tablet (2.5 mg total) by mouth daily with breakfast. Start taking on: April 03, 2020       Follow-up Information    Renato Shin, MD Follow up.   Specialty: Endocrinology Why: Work-up for possible underlying adrenal insufficiency and history of pituitary tumor Contact information: [address] [place] [address] [phone]        Jerline Pain, MD. Schedule an appointment as soon as possible for a visit in 2 week(s).   Specialty: Cardiology Why: Bradycardia Contact information: [id number] [place] [address] [phone]        [place]. Schedule an appointment as soon as possible for a visit in 1 week(s).   Contact information: [address] [place] [phone]          Major procedures and Radiology Reports - PLEASE review detailed and final reports thoroughly  -       CT Head Wo Contrast  Result Date: 03/28/2020 CLINICAL DATA:   44 year old female with altered mental status. Positive COVID-19. EXAM: CT HEAD WITHOUT CONTRAST TECHNIQUE: Contiguous axial images were obtained from the base of the skull through the vertex without intravenous contrast. COMPARISON:  None. FINDINGS: Brain: The ventricles and sulci appropriate in size for patient's age. The gray-white matter discrimination is preserved. There is no acute intracranial hemorrhage. No midline shift. 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 There is loss of continuity of the anterior sella with extension of the sellar tissue into the sphenoid sinuses. Dedicated pituitary MRI without and with contrast is recommended for better evaluation. Vascular: No hyperdense vessel or unexpected calcification. Skull: Normal. Negative for fracture or focal lesion. Sinuses/Orbits: There is mild mucoperiosteal thickening of paranasal sinuses. Partial opacification of the sphenoid sinuses. There is a 1.5 cm right maxillary sinus retention cyst or polyp. The right mastoid air cells are clear. Left mastoid effusions noted. Other: None IMPRESSION: 1. No acute intracranial hemorrhage. 2. Heterogeneous sellar mass as above. Dedicated pituitary MRI without and with contrast is recommended for better evaluation. 3. Left mastoid effusions. Electronically Signed   By: Anner Crete M.D.   On: 03/28/2020 17:01   DG Chest Port 1 View  Result Date: 03/28/2020 CLINICAL DATA:  Pt reports generalized weakness and fatigue for the past week, not eating or drinking much due to nausea. 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 Pt ill appearing in triage EXAM: PORTABLE CHEST 1 VIEW COMPARISON:  None. FINDINGS: Normal heart, mediastinum and hila. Lungs are clear.  No pleural effusion or pneumothorax. Skeletal structures are grossly intact. IMPRESSION: No active disease. Electronically Signed   By: Lajean Manes M.D.   On: 03/28/2020 12:00    ECHOCARDIOGRAM COMPLETE  Result Date: 03/31/2020    ECHOCARDIOGRAM REPORT   Patient Name:   [facility] Zanni Date of Exam: 03/31/2020 Medical Rec #:  [id number]       Height:       70.0 in Accession #:    [id number]      Weight:       167.8 lb Date of Birth:  1976/08/27      BSA:          1.937 m Patient Age:    44 years        BP:  157/81 mmHg Patient Gender: F               HR:           45 bpm. Exam Location:  Inpatient Procedure: 2D Echo, Cardiac Doppler and Color Doppler Indications:    Abnormal ECG 794.31 / R94.31  History:        Patient has no prior history of Echocardiogram examinations.                 Risk Factors:Non-Smoker.  Sonographer:    Vickie Epley RDCS Referring Phys: [id number] [place]  Sonographer Comments: Covid positive. IMPRESSIONS  1. Left ventricular ejection fraction, by estimation, is 60 to 65%. The left ventricle has normal function. The left ventricle has no regional wall motion abnormalities. Left ventricular diastolic parameters were normal.  2. Right ventricular systolic function is normal. The right ventricular size is normal.  3. The mitral valve is myxomatous. Trivial mitral valve regurgitation. No evidence of mitral stenosis.  4. The aortic valve is normal in structure. Aortic valve regurgitation is not visualized. No aortic stenosis is present.  5. The inferior vena cava is dilated in size with <50% respiratory variability, suggesting right atrial pressure of 15 mmHg. FINDINGS  Left Ventricle: Left ventricular ejection fraction, by estimation, is 60 to 65%. The left ventricle has normal function. The left ventricle has no regional wall motion abnormalities. The left ventricular internal cavity size was normal in size. There is  no left ventricular hypertrophy. Left ventricular diastolic parameters were normal. Right Ventricle: The right ventricular size is normal. No increase in right ventricular wall thickness. Right ventricular systolic function is normal.  Left Atrium: Left atrial size was normal in size. Right Atrium: Right atrial size was normal in size. Pericardium: Trivial pericardial effusion is present. The pericardial effusion is posterior to the left ventricle. There is no evidence of cardiac tamponade. 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 Normal mobility of the mitral valve leaflets. Trivial mitral valve regurgitation. No evidence of mitral valve stenosis. Tricuspid Valve: The tricuspid valve is normal in structure. Tricuspid valve regurgitation is not demonstrated. No evidence of tricuspid stenosis. Aortic Valve: The aortic valve is normal in structure. Aortic valve regurgitation is not visualized. No aortic stenosis is present. Pulmonic Valve: The pulmonic valve was normal in structure. Pulmonic valve regurgitation is not visualized. No evidence of pulmonic stenosis. Aorta: The aortic root is normal in size and structure. Venous: The inferior vena cava is dilated in size with less than 50% respiratory variability, suggesting right atrial pressure of 15 mmHg. IAS/Shunts: No atrial level shunt detected by color flow Doppler.  LEFT VENTRICLE PLAX 2D LVIDd:         4.80 cm  Diastology LVIDs:         3.20 cm  LV e' lateral:   13.70 cm/s LV PW:         0.50 cm  LV E/e' lateral: 5.3 LV IVS:        0.50 cm  LV e' medial:    10.40 cm/s LVOT diam:     1.80 cm  LV E/e' medial:  7.0 LV SV:         59 LV SV Index:   30 LVOT Area:     2.54 cm  RIGHT VENTRICLE RV S prime:     10.30 cm/s TAPSE (M-mode): 2.0 cm LEFT ATRIUM  Index       RIGHT ATRIUM          Index LA diam:      3.30 cm 1.70 cm/m  RA Area:     9.91 cm LA Vol (A2C): 28.4 ml 14.66 ml/m RA Volume:   20.40 ml 10.53 ml/m LA Vol (A4C): 37.9 ml 19.56 ml/m  AORTIC VALVE LVOT Vmax:   93.50 cm/s LVOT Vmean:  60.500 cm/s LVOT VTI:    0.232 m  AORTA Ao Root diam: 2.60 cm MITRAL VALVE MV Area  (PHT): 3.89 cm    SHUNTS MV Decel Time: 195 msec    Systemic VTI:  0.23 m MV E velocity: 72.90 cm/s  Systemic Diam: 1.80 cm MV A velocity: 23.80 cm/s MV E/A ratio:  3.06 Candee Furbish MD Electronically signed by Candee Furbish MD Signature Date/Time: 03/31/2020/2:05:46 PM    Final     Micro Results     Recent Results (from the past 240 hour(s))  Blood Culture (routine x 2)     Status: None   Collection Time: 03/28/20 11:21 AM   Specimen: BLOOD  Result Value Ref Range Status   Specimen Description BLOOD RIGHT ANTECUBITAL  Final   Special Requests   Final    BOTTLES DRAWN AEROBIC AND ANAEROBIC Blood Culture adequate volume   Culture   Final    NO GROWTH 5 DAYS Performed at [facility], [address], [place], [place] [address]    Report Status 04/02/2020 FINAL  Final  Blood Culture (routine x 2)     Status: None   Collection Time: 03/28/20 11:21 AM   Specimen: BLOOD  Result Value Ref Range Status   Specimen Description BLOOD RIGHT ANTECUBITAL  Final   Special Requests   Final    BOTTLES DRAWN AEROBIC AND ANAEROBIC Blood Culture results may not be optimal due to an inadequate volume of blood received in culture bottles   Culture   Final    NO GROWTH 5 DAYS Performed at [facility], [place] [address] [place], [place] [address]    Report Status 04/02/2020 FINAL  Final  Urine culture     Status: None   Collection Time: 03/28/20 11:43 AM   Specimen: In/Out Cath Urine  Result Value Ref Range Status   Specimen Description IN/OUT CATH URINE  Final   Special Requests NONE  Final   Culture   Final    NO GROWTH Performed at [facility], [place] [address] [place], [place] [address]    Report Status 03/29/2020 FINAL  Final  Respiratory Panel by RT PCR (Flu A&B, Covid) - Nasopharyngeal Swab     Status: Abnormal   Collection Time: 03/28/20 11:43 AM   Specimen: Nasopharyngeal Swab  Result Value Ref Range Status   SARS Coronavirus 2 by RT PCR POSITIVE (A) NEGATIVE Final     Comment: RESULT CALLED TO, READ BACK BY AND VERIFIED WITH: Chalmers Cater RN 13:20 03/28/20 ([email or web address]) (NOTE) SARS-CoV-2 target nucleic acids are DETECTED. SARS-CoV-2 RNA is generally detectable in upper respiratory specimens  during the acute phase of infection. Positive results are indicative of the presence of the identified virus, but do not rule out bacterial infection or co-infection with other pathogens not detected by the test. Clinical correlation with patient history and other diagnostic information is necessary to determine patient infection status. The expected result is Negative. Fact Sheet for Patients:  PinkCheek.be Fact Sheet for Healthcare Providers: GravelBags.it This test is not yet  approved or cleared by the [place] and  has been authorized for detection and/or diagnosis of SARS-CoV-2 by FDA under an Emergency Use Authorization (EUA).  This EUA will remain in effect (meaning this test can be used)  for the duration of  the COVID-19 declaration under Section 564(b)(1) of the Act, 21 U.S.C. section 360bbb-3(b)(1), unless the authorization is terminated or revoked sooner.    Influenza A by PCR NEGATIVE NEGATIVE Final   Influenza B by PCR NEGATIVE NEGATIVE Final    Comment: (NOTE) The Xpert Xpress SARS-CoV-2/FLU/RSV assay is intended as an aid in  the diagnosis of influenza from Nasopharyngeal swab specimens and  should not be used as a sole basis for treatment. Nasal washings and  aspirates are unacceptable for Xpert Xpress SARS-CoV-2/FLU/RSV  testing. Fact Sheet for Patients: PinkCheek.be Fact Sheet for Healthcare Providers: GravelBags.it This test is not yet approved or cleared by the [place] FDA and  has been authorized for detection and/or diagnosis of SARS-CoV-2 by  FDA under an Emergency Use Authorization (EUA). This EUA will remain   in effect (meaning this test can be used) for the duration of the  Covid-19 declaration under Section 564(b)(1) of the Act, 21  U.S.C. section 360bbb-3(b)(1), unless the authorization is  terminated or revoked. Performed at [facility], [place] [address] [place], [place] [address]   MRSA PCR Screening     Status: None   Collection Time: 03/28/20  8:05 PM   Specimen: Nasal Mucosa; Nasopharyngeal  Result Value Ref Range Status   MRSA by PCR NEGATIVE NEGATIVE Final    Comment:        The GeneXpert MRSA Assay (FDA approved for NASAL specimens only), is one component of a comprehensive MRSA colonization surveillance program. It is not intended to diagnose MRSA infection nor to guide or monitor treatment for MRSA infections. Performed at [facility], [place] [address], [place], [place] [address]     Today   Subjective    Kristen Williamson today has no headache,no chest abdominal pain,no new weakness tingling or numbness, feels much better wants to go home today.    Objective   Blood pressure 115/77, pulse (!) 55, temperature 98.1 F (36.7 C), resp. rate 18, height 5\' 10"  (1.778 m), weight 78.1 kg, SpO2 99 %.   Intake/Output Summary (Last 24 hours) at 04/02/2020 1000 Last data filed at 04/01/2020 2218 Gross per 24 hour  Intake 300 ml  Output --  Net 300 ml    Exam Awake Alert,   No new F.N deficits, Normal affect [place].AT,PERRAL Supple Neck,No JVD, No cervical lymphadenopathy appriciated.  Symmetrical Chest wall movement, Good air movement bilaterally, CTAB RRR,No Gallops,Rubs or new Murmurs, No Parasternal Heave +ve B.Sounds, Abd Soft, Non tender, No organomegaly appriciated, No rebound -guarding or rigidity. No Cyanosis, Clubbing or edema, No new Rash or bruise   Data Review   CBC w Diff:  Lab Results  Component Value Date   WBC 2.9 (L) 04/02/2020   HGB 11.9 (L) 04/02/2020   HCT 36.5 04/02/2020   PLT 168 04/02/2020   LYMPHOPCT 14 03/29/2020   MONOPCT 5  03/29/2020   EOSPCT 0 03/29/2020   BASOPCT 0 03/29/2020    CMP:  Lab Results  Component Value Date   NA 141 04/02/2020   K 3.6 04/02/2020   CL 105 04/02/2020   CO2 28 04/02/2020   BUN 8 04/02/2020   CREATININE 0.86 04/02/2020   PROT 6.2 (L) 04/02/2020  ALBUMIN 3.5 04/02/2020   BILITOT 0.9 04/02/2020   ALKPHOS 53 04/02/2020   AST 58 (H) 04/02/2020   ALT 39 04/02/2020  .   Total Time in preparing paper work, data evaluation and todays exam - 44 minutes  Lala Lund M.D on 04/02/2020 at 10:00 AM  [facility]   Office  [phone]

## 2020-04-02 NOTE — Discharge Instructions (Signed)
Follow with Primary MD in 7 days   Get CBC, CMP, 2 view Chest X ray -  checked next visit within 1 week by Primary MD   Activity: As tolerated with Full fall precautions use walker/cane & assistance as needed  Disposition Home    Diet: Heart Healthy    Special Instructions: If you have smoked or chewed Tobacco  in the last 2 yrs please stop smoking, stop any regular Alcohol  and or any Recreational drug use.  On your next visit with your primary care physician please Get Medicines reviewed and adjusted.  Please request your Prim.MD to go over all Hospital Tests and Procedure/Radiological results at the follow up, please get all Hospital records sent to your Prim MD by signing hospital release before you go home.  If you experience worsening of your admission symptoms, develop shortness of breath, life threatening emergency, suicidal or homicidal thoughts you must seek medical attention immediately by calling 911 or calling your MD immediately  if symptoms less severe.  You Must read complete instructions/literature along with all the possible adverse reactions/side effects for all the Medicines you take and that have been prescribed to you. Take any new Medicines after you have completely understood and accpet all the possible adverse reactions/side effects.       Person Under Monitoring Name: Kristen Williamson  Location: [place] [address]   Infection Prevention Recommendations for Individuals Confirmed to have, or Being Evaluated for, 2019 Novel Coronavirus (COVID-19) Infection Who Receive Care at Home  Individuals who are confirmed to have, or are being evaluated for, COVID-19 should follow the prevention steps below until a healthcare provider or local or state health department says they can return to normal activities.  Stay home except to get medical care You should restrict activities outside your home, except for getting medical care. Do not go to work,  school, or public areas, and do not use public transportation or taxis.  Call ahead before visiting your doctor Before your medical appointment, call the healthcare provider and tell them that you have, or are being evaluated for, COVID-19 infection. This will help the healthcare provider's office take steps to keep other people from getting infected. Ask your healthcare provider to call the local or state health department.  Monitor your symptoms Seek prompt medical attention if your illness is worsening (e.g., difficulty breathing). Before going to your medical appointment, call the healthcare provider and tell them that you have, or are being evaluated for, COVID-19 infection. Ask your healthcare provider to call the local or state health department.  Wear a facemask You should wear a facemask that covers your nose and mouth when you are in the same room with other people and when you visit a healthcare provider. People who live with or visit you should also wear a facemask while they are in the same room with you.  Separate yourself from other people in your home As much as possible, you should stay in a different room from other people in your home. Also, you should use a separate bathroom, if available.  Avoid sharing household items You should not share dishes, drinking glasses, cups, eating utensils, towels, bedding, or other items with other people in your home. After using these items, you should wash them thoroughly with soap and water.  Cover your coughs and sneezes Cover your mouth and nose with a tissue when you cough or sneeze, or you can cough or sneeze into your sleeve.  Throw used tissues in a lined trash can, and immediately wash your hands with soap and water for at least 20 seconds or use an alcohol-based hand rub.  Wash your [facility] your hands often and thoroughly with soap and water for at least 20 seconds. You can use an alcohol-based hand sanitizer if soap  and water are not available and if your hands are not visibly dirty. Avoid touching your eyes, nose, and mouth with unwashed hands.   Prevention Steps for Caregivers and Household Members of Individuals Confirmed to have, or Being Evaluated for, COVID-19 Infection Being Cared for in the Home  If you live with, or provide care at home for, a person confirmed to have, or being evaluated for, COVID-19 infection please follow these guidelines to prevent infection:  Follow healthcare provider's instructions Make sure that you understand and can help the patient follow any healthcare provider instructions for all care.  Provide for the patient's basic needs You should help the patient with basic needs in the home and provide support for getting groceries, prescriptions, and other personal needs.  Monitor the patient's symptoms If they are getting sicker, call his or her medical provider and tell them that the patient has, or is being evaluated for, COVID-19 infection. This will help the healthcare provider's office take steps to keep other people from getting infected. Ask the healthcare provider to call the local or state health department.  Limit the number of people who have contact with the patient  If possible, have only one caregiver for the patient.  Other household members should stay in another home or place of residence. If this is not possible, they should stay  in another room, or be separated from the patient as much as possible. Use a separate bathroom, if available.  Restrict visitors who do not have an essential need to be in the home.  Keep older adults, very young children, and other sick people away from the patient Keep older adults, very young children, and those who have compromised immune systems or chronic health conditions away from the patient. This includes people with chronic heart, lung, or kidney conditions, diabetes, and cancer.  Ensure good  ventilation Make sure that shared spaces in the home have good air flow, such as from an air conditioner or an opened window, weather permitting.  Wash your hands often  Wash your hands often and thoroughly with soap and water for at least 20 seconds. You can use an alcohol based hand sanitizer if soap and water are not available and if your hands are not visibly dirty.  Avoid touching your eyes, nose, and mouth with unwashed hands.  Use disposable paper towels to dry your hands. If not available, use dedicated cloth towels and replace them when they become wet.  Wear a facemask and gloves  Wear a disposable facemask at all times in the room and gloves when you touch or have contact with the patient's blood, body fluids, and/or secretions or excretions, such as sweat, saliva, sputum, nasal mucus, vomit, urine, or feces.  Ensure the mask fits over your nose and mouth tightly, and do not touch it during use.  Throw out disposable facemasks and gloves after using them. Do not reuse.  Wash your hands immediately after removing your facemask and gloves.  If your personal clothing becomes contaminated, carefully remove clothing and launder. Wash your hands after handling contaminated clothing.  Place all used disposable facemasks, gloves, and other waste in a lined container  before disposing them with other household waste.  Remove gloves and wash your hands immediately after handling these items.  Do not share dishes, glasses, or other household items with the patient  Avoid sharing household items. You should not share dishes, drinking glasses, cups, eating utensils, towels, bedding, or other items with a patient who is confirmed to have, or being evaluated for, COVID-19 infection.  After the person uses these items, you should wash them thoroughly with soap and water.  Wash laundry thoroughly  Immediately remove and wash clothes or bedding that have blood, body fluids, and/or  secretions or excretions, such as sweat, saliva, sputum, nasal mucus, vomit, urine, or feces, on them.  Wear gloves when handling laundry from the patient.  Read and follow directions on labels of laundry or clothing items and detergent. In general, wash and dry with the warmest temperatures recommended on the label.  Clean all areas the individual has used often  Clean all touchable surfaces, such as counters, tabletops, doorknobs, bathroom fixtures, toilets, phones, keyboards, tablets, and bedside tables, every day. Also, clean any surfaces that may have blood, body fluids, and/or secretions or excretions on them.  Wear gloves when cleaning surfaces the patient has come in contact with.  Use a diluted bleach solution (e.g., dilute bleach with 1 part bleach and 10 parts water) or a household disinfectant with a label that says EPA-registered for coronaviruses. To make a bleach solution at home, add 1 tablespoon of bleach to 1 quart (4 cups) of water. For a larger supply, add  cup of bleach to 1 gallon (16 cups) of water.  Read labels of cleaning products and follow recommendations provided on product labels. Labels contain instructions for safe and effective use of the cleaning product including precautions you should take when applying the product, such as wearing gloves or eye protection and making sure you have good ventilation during use of the product.  Remove gloves and wash hands immediately after cleaning.  Monitor yourself for signs and symptoms of illness Caregivers and household members are considered close contacts, should monitor their health, and will be asked to limit movement outside of the home to the extent possible. Follow the monitoring steps for close contacts listed on the symptom monitoring form.   ? If you have additional questions, contact your local health department or call the epidemiologist on call at [phone] (available 24/7). ? This guidance is subject  to change. For the most up-to-date guidance from [facility], please refer to their website: YouBlogs.pl

## 2020-04-02 NOTE — Care Management (Addendum)
Pt deemed stable for discharge home today.  Pt does not have a PCP listed in Epic - [facility] information provided on AVS.  CM reviewed pt's chart for TOC needs.  Discharge order written - CM signing off

## 2020-04-02 NOTE — Progress Notes (Signed)
Message sent to MD Silas [place]) at [facility] inquiring about order for NS continuous infusion at 10-20 ml/hr.  Was stopped on 03/28/20 and has not been restarted since with no D/C order or change to order. Awaiting response. Will discuss with day shift RN. Norton Blizzard, RN 04/02/2020 [phone]

## 2020-05-02 ENCOUNTER — Other Ambulatory Visit: Payer: Self-pay | Admitting: Neurosurgery

## 2020-05-25 NOTE — Progress Notes (Signed)
[id number] [address], [place] [address] Phone: [phone] Fax: [phone]      Your procedure is scheduled on May 30, 2020.  Report to [facility] Main Entrance "A" at 08:00 A.M., and check in at the Admitting office.  Call this number if you have problems the morning of surgery:  [phone]  Call [phone] if you have any questions prior to your surgery date Monday-Friday 8am-4pm    Remember:  Do not eat or drink after midnight the night before your surgery    Take these medicines the morning of surgery with A SIP OF WATER : NONE  As of today, STOP taking any Aspirin (unless otherwise instructed by your surgeon) and Aspirin containing products, Aleve, Naproxen, Ibuprofen, Motrin, Advil, Goody's, BC's, all herbal medications, fish oil, and all vitamins.                     Do not wear jewelry, make up, or nail polish            Do not wear lotions, powders, perfumes/colognes, or deodorant.            Do not shave 48 hours prior to surgery.             Do not bring valuables to the hospital.            [facility] is not responsible for any belongings or valuables.  Do NOT Smoke (Tobacco/Vapping) or drink Alcohol 24 hours prior to your procedure If you use a CPAP at night, you may bring all equipment for your overnight stay.   Contacts, glasses, dentures or bridgework may not be worn into surgery.      For patients admitted to the hospital, discharge time will be determined by your treatment team.   Patients discharged the day of surgery will not be allowed to drive home, and someone needs to stay with them for 24 hours.    Special instructions:   [facility]- Preparing For Surgery  Before surgery, you can play an important role. Because skin is not sterile, your skin needs to be as free of germs as possible. You can reduce the number of germs on your skin by washing with CHG (chlorahexidine  gluconate) Soap before surgery.  CHG is an antiseptic cleaner which kills germs and bonds with the skin to continue killing germs even after washing.    Oral Hygiene is also important to reduce your risk of infection.  Remember - BRUSH YOUR TEETH THE MORNING OF SURGERY WITH YOUR REGULAR TOOTHPASTE  Please do not use if you have an allergy to CHG or antibacterial soaps. If your skin becomes reddened/irritated stop using the CHG.  Do not shave (including legs and underarms) for at least 48 hours prior to first CHG shower. It is OK to shave your face.  Please follow these instructions carefully.   1. Shower the NIGHT BEFORE SURGERY and the MORNING OF SURGERY with CHG Soap.   2. If you chose to wash your hair, wash your hair first as usual with your normal shampoo.  3. After you shampoo, rinse your hair and body thoroughly to remove the shampoo.  4. Use CHG as you would any other liquid soap. You can apply CHG directly to the skin and wash gently with a scrungie or a clean washcloth.   5. Apply the CHG Soap to your body ONLY FROM THE NECK DOWN.  Do not use on open wounds or open sores. Avoid contact with your eyes, ears, mouth and genitals (private parts). Wash Face and genitals (private parts)  with your normal soap.   6. Wash thoroughly, paying special attention to the area where your surgery will be performed.  7. Thoroughly rinse your body with warm water from the neck down.  8. DO NOT shower/wash with your normal soap after using and rinsing off the CHG Soap.  9. Pat yourself dry with a CLEAN TOWEL.  10. Wear CLEAN PAJAMAS to bed the night before surgery, wear comfortable clothes the morning of surgery  11. Place CLEAN SHEETS on your bed the night of your first shower and DO NOT SLEEP WITH PETS.   Day of Surgery:   Do not apply any deodorants/lotions.  Please wear clean clothes to the hospital/surgery center.   Remember to brush your teeth WITH YOUR REGULAR TOOTHPASTE.   Please  read over the following fact sheets that you were given.

## 2020-05-28 ENCOUNTER — Encounter (HOSPITAL_COMMUNITY): Payer: Self-pay

## 2020-05-28 ENCOUNTER — Inpatient Hospital Stay (HOSPITAL_COMMUNITY)
Admission: RE | Admit: 2020-05-28 | Discharge: 2020-05-28 | Disposition: A | Discharge status: Home / Self Care | Payer: Medicaid Other | Source: Ambulatory Visit

## 2020-05-28 NOTE — Progress Notes (Addendum)
    Kristen Williamson  05/28/2020     PCP - Dr. Ralph Leyden with [facility] on [address]  Cardiologist - Denies  Chest x-ray -  EKG - 03/30/20 Stress Test - Denies ECHO - 03/31/20 (Patient did not recall this) Had SB in hospital on Covid admission EF 60-65 Did not know to f/u with a cardiologist for the  SB 40-50's dipped to 30's per Dr. Keturah Barre AVS Cardiac Cath - Denies  Sleep Study - Denies  DM-Denies  Aspirin Instructions: None told to stop ASA and NSAIDs (Was taking Aleve)  ERAS Protcol -No  COVID TEST- No appt scheduled left message for [facility]. Pt was covid positive 03/28/20  Anesthesia review: Yes SB on Covid admission no cardiology f/u scheduled for her. EF 60-65 on 03/31/20  Patient denies shortness of breath, fever, cough and chest pain at PAT phone appt. Pt called at [place] stating she was 30 min behind. Did a phone call visit with her giving instructions to be here on 23rd at 0730.     All instructions explained to the patient, with a verbal understanding of the material. Patient agrees to go over the instructions while at home for a better understanding. Patient also instructed to self quarantine after being tested for COVID-19. The opportunity to ask questions was provided.

## 2020-05-29 NOTE — Progress Notes (Signed)
Anesthesia Chart Review: SAME DAY WORK-UP (she did not arrive for 05/28/20 PAT visit and would be > 30 minutes late when contacted about appointment, so phone interview done)   Case: [id number] Date/Time: 05/30/20 0945   Procedures:      Transphenoidal Resection of Tumor (N/A )     TRANSPHENOIDAL APPROACH EXPOSURE (N/A )   Anesthesia type: General   Pre-op diagnosis: Invasive pituitary tumor   Location: [facility] [place]   Surgeons: Ashok Pall, MD; Izora Gala, MD      DISCUSSION: Patient is a 44 year old female scheduled for the above procedure.  History includes former smoker (quit 12/09/19), pituitary tumor (transnasal pituitary resection in [place], [place] ~ 2018 with residual mass, significant enlargement on 03/02/20 MRI), hysterectomy.   Patient hospitalized 03/28/20-04/02/20 with N/V (x 3-4 days), weakness, + COVID-19.  Respiratory status stable, but remained hypotensive in the ED despite IVF. AKI with Cr 1.87. Mild transaminitis felt likely related to COVID-19. Lactic acid was normal. Undergoing work-up for pituitary tumor with planned resection. Question if adrenal insufficiency was causing hypotension. Plasma Cortisol 7.3, . PCCM admitted. Remdesivir ordered and stress dose steroids. Blood cultures negative. While hospitalized she had bradycardia 40-50's with one episode with HR to 33 related to 2.33 second pause. Cardiology consulted and evaluated by Ena Dawley, MD on 03/30/20. BP and Creatinine improving. TSH normal. Echo ordered which was normal. She was asymptomatic of bradycardia which was improving. Last visit 03/31/20 by Candee Furbish, MD wrote, "No indication for pacer. Will sign off. Avoid AV blocking agents." (Cardiology did not mention need for out-patient follow-up.) Transferred to PCCM to [facility] 03/30/20. On-going out-patient follow-up with endocrinology/neurosurgery for pituitary tumor management and planned resection.   Since +COVID-19 on 03/28/20, she will not need a preoperative  COVID-19 test. She denied SOB, fever, cough, chest pain per PAT RN phone interview. I left a message for Janett Billow at Dr. Lacy Duverney office that patient did not come for PAT visit and would be a same day labs unless they were able to get patient to come in for labs by early afternoon on 05/29/20. Anesthesia team to evaluate on the day of surgery.      VS: Ht 5\' 10"  (1.778 m)   Wt 66.7 kg   BMI 21.09 kg/m  Wt Readings from Last 3 Encounters:  05/28/20 66.7 kg  04/02/20 78.1 kg   BP Readings from Last 3 Encounters:  04/02/20 115/77  02/16/20 126/87  10/17/15 93/67   Pulse Readings from Last 3 Encounters:  04/02/20 (!) 55  02/16/20 88  10/17/15 80    PROVIDERS: Nolene Ebbs, MD is PCP ([place])   LABS: See DISCUSSION. Currently last lab results include: Lab Results  Component Value Date   WBC 2.9 (L) 04/02/2020   HGB 11.9 (L) 04/02/2020   HCT 36.5 04/02/2020   PLT 168 04/02/2020   GLUCOSE 90 04/02/2020   ALT 39 04/02/2020   AST 58 (H) 04/02/2020   NA 141 04/02/2020   K 3.6 04/02/2020   CL 105 04/02/2020   CREATININE 0.86 04/02/2020   BUN 8 04/02/2020   CO2 28 04/02/2020   TSH 1.482 03/28/2020   INR 1.1 03/28/2020   HGBA1C 5.6 03/28/2020     IMAGES: 1V CXR 03/28/20: FINDINGS: Normal heart, mediastinum and hila. Lungs are clear.  No pleural effusion or pneumothorax. Skeletal structures are grossly intact. IMPRESSION: No active disease.  CT Head 03/28/20: IMPRESSION: 1. No acute intracranial hemorrhage. 2. Heterogeneous sellar mass  as above. Dedicated pituitary MRI without and with contrast is recommended for better evaluation. 3. Left mastoid effusions.  MRI Brain 03/02/20 ([facility]): FINDINGS:  - There is a large mass in the sella which has increased substantially in size when compared to the previous MRI dated 10/14/2018. The mass measures 3.4 cm in transverse diameter, 2.6 cm in superior-inferior diameter, and 2.3 cm in anterior posterior  diameter. The sella turcica is enlarged. The mass bulges laterally with displacement of the internal carotid arteries bilaterally. No definite encasement of the internal carotid arteries. The mass extends superiorly into the suprasellarcistern with impingement on and some displacement of the optic chiasm. The lesion appears to be predominantly cystic although there is a substantial solid component to the mass.  - The ventricles are normal without dilatation, mass effect, or midline shift. No abnormal enhancement in the brain parenchyma. Changes in the nasal cavity consistent with prior transsphenoidal surgery. IMPRESSION: Substantial enlargement of the pituitary tumor which appears to be mixed cystic and solid. There is lateral displacement of the cavernous portions of the internal carotid arteries bilaterally. No definite encasement of the internal carotid arteries. 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     EKG: The following EKGs were done during admission for COVID-19, gastroenteritis, AKI, hypotension, possible adrenal insufficiency. Cardiology consulted due to bradycardia. No indication for PPM as of 03/31/20.   - EKG 03/30/20 Sinus bradycardia at 46 bpm Otherwise normal ECG Confirmed by Kate Sable [phone]) on 03/31/2020 11:21:22 AM  - EKG 03/28/20: Sinus rhythm Probable left ventricular hypertrophy Prolonged QT interval   CV: Echo 03/31/20: IMPRESSIONS  1. Left ventricular ejection fraction, by estimation, is 60 to 65%. The  left ventricle has normal function. The left ventricle has no regional  wall motion abnormalities. Left ventricular diastolic parameters were  normal.  2. Right ventricular systolic function is normal. The right ventricular  size is normal.  3. The mitral valve is myxomatous. Trivial mitral valve regurgitation. No  evidence of mitral stenosis.  4. The aortic valve is normal in structure. Aortic valve  regurgitation is  not visualized. No aortic stenosis is present.  5. The inferior vena cava is dilated in size with <50% respiratory  variability, suggesting right atrial pressure of 15 mmHg.    Past Medical History:  Diagnosis Date  . Pituitary tumor     Past Surgical History:  Procedure Laterality Date  . ABDOMINAL HYSTERECTOMY    . birth mark removed      MEDICATIONS: . bimatoprost (LUMIGAN) 0.01 % SOLN   No current facility-administered medications for this encounter.    Myra Gianotti, PA-C Surgical Short Stay/Anesthesiology [facility] Phone [phone] [facility] Phone [phone] 05/29/2020 11:17 AM

## 2020-05-29 NOTE — Anesthesia Preprocedure Evaluation (Addendum)
Anesthesia Evaluation  Patient identified by MRN, date of birth, ID band Patient awake    Reviewed: Allergy & Precautions, NPO status , Patient's Chart, lab work & pertinent test results  Airway Mallampati: II  TM Distance: >3 FB Neck ROM: Full    Dental  (+) Teeth Intact, Dental Advisory Given   Pulmonary Patient abstained from smoking., former smoker,    breath sounds clear to auscultation       Cardiovascular  Rhythm:Regular Rate:Normal     Neuro/Psych    GI/Hepatic   Endo/Other    Renal/GU      Musculoskeletal   Abdominal   Peds  Hematology   Anesthesia Other Findings   Reproductive/Obstetrics                            Anesthesia Physical Anesthesia Plan  ASA: III  Anesthesia Plan: General   Post-op Pain Management:    Induction: Intravenous  PONV Risk Score and Plan: Ondansetron and Dexamethasone  Airway Management Planned: Oral ETT  Additional Equipment: Arterial line  Intra-op Plan:   Post-operative Plan: Extubation in OR  Informed Consent: I have reviewed the patients History and Physical, chart, labs and discussed the procedure including the risks, benefits and alternatives for the proposed anesthesia with the patient or authorized representative who has indicated his/her understanding and acceptance.     Dental advisory given  Plan Discussed with: CRNA and Anesthesiologist  Anesthesia Plan Comments: (PAT note written 05/29/2020 by Myra Gianotti, PA-C. )      Anesthesia Quick Evaluation

## 2020-05-30 ENCOUNTER — Other Ambulatory Visit: Payer: Self-pay

## 2020-05-30 ENCOUNTER — Inpatient Hospital Stay (HOSPITAL_COMMUNITY): Payer: Medicaid Other

## 2020-05-30 ENCOUNTER — Encounter (HOSPITAL_COMMUNITY)
Admission: RE | Disposition: A | Discharge status: Home / Self Care | Payer: Self-pay | Source: Home / Self Care | Attending: Neurosurgery

## 2020-05-30 ENCOUNTER — Encounter (HOSPITAL_COMMUNITY): Payer: Self-pay | Admitting: Neurosurgery

## 2020-05-30 ENCOUNTER — Inpatient Hospital Stay (HOSPITAL_COMMUNITY)
Admission: RE | Admit: 2020-05-30 | Discharge: 2020-06-01 | DRG: 615 | Disposition: A | Discharge status: Home / Self Care | Payer: Medicaid Other | Attending: Neurosurgery | Admitting: Neurosurgery

## 2020-05-30 ENCOUNTER — Inpatient Hospital Stay (HOSPITAL_COMMUNITY): Payer: Medicaid Other | Admitting: Anesthesiology

## 2020-05-30 DIAGNOSIS — D352 Benign neoplasm of pituitary gland: Principal | ICD-10-CM | POA: Diagnosis present

## 2020-05-30 DIAGNOSIS — Z8616 Personal history of COVID-19: Secondary | ICD-10-CM

## 2020-05-30 DIAGNOSIS — Z7952 Long term (current) use of systemic steroids: Secondary | ICD-10-CM

## 2020-05-30 DIAGNOSIS — Z87891 Personal history of nicotine dependence: Secondary | ICD-10-CM | POA: Diagnosis not present

## 2020-05-30 DIAGNOSIS — Z7289 Other problems related to lifestyle: Secondary | ICD-10-CM | POA: Diagnosis not present

## 2020-05-30 DIAGNOSIS — Z419 Encounter for procedure for purposes other than remedying health state, unspecified: Secondary | ICD-10-CM

## 2020-05-30 DIAGNOSIS — D497 Neoplasm of unspecified behavior of endocrine glands and other parts of nervous system: Secondary | ICD-10-CM | POA: Diagnosis present

## 2020-05-30 HISTORY — PX: TRANSPHENOIDAL APPROACH EXPOSURE: SHX6311

## 2020-05-30 HISTORY — PX: CRANIOTOMY: SHX93

## 2020-05-30 LAB — TYPE AND SCREEN
ABO/RH(D): O POS
Antibody Screen: NEGATIVE

## 2020-05-30 LAB — CBC
HCT: 37.8 % (ref 36.0–46.0)
HCT: 37.1 % (ref 36.0–46.0)
Hemoglobin: 11.9 g/dL — ABNORMAL LOW (ref 12.0–15.0)
Hemoglobin: 12.3 g/dL (ref 12.0–15.0)
MCH: 26.3 pg (ref 26.0–34.0)
MCH: 26.8 pg (ref 26.0–34.0)
MCHC: 31.5 g/dL (ref 30.0–36.0)
MCHC: 33.2 g/dL (ref 30.0–36.0)
MCV: 83.6 fL (ref 80.0–100.0)
MCV: 80.8 fL (ref 80.0–100.0)
Platelets: 223 10*3/uL (ref 150–400)
Platelets: 239 10*3/uL (ref 150–400)
RBC: 4.52 MIL/uL (ref 3.87–5.11)
RBC: 4.59 MIL/uL (ref 3.87–5.11)
RDW: 12.6 % (ref 11.5–15.5)
RDW: 12.7 % (ref 11.5–15.5)
WBC: 2.9 10*3/uL — ABNORMAL LOW (ref 4.0–10.5)
WBC: 7 10*3/uL (ref 4.0–10.5)
nRBC: 0 % (ref 0.0–0.2)
nRBC: 0 % (ref 0.0–0.2)

## 2020-05-30 LAB — BASIC METABOLIC PANEL
Anion gap: 9 (ref 5–15)
BUN: 5 mg/dL — ABNORMAL LOW (ref 6–20)
CO2: 26 mmol/L (ref 22–32)
Calcium: 9.1 mg/dL (ref 8.9–10.3)
Chloride: 107 mmol/L (ref 98–111)
Creatinine, Ser: 0.74 mg/dL (ref 0.44–1.00)
GFR calc Af Amer: >60 mL/min (ref >60)
GFR calc non Af Amer: >60 mL/min (ref >60)
Glucose, Bld: 83 mg/dL (ref 70–99)
Potassium: 3.8 mmol/L (ref 3.5–5.1)
Sodium: 142 mmol/L (ref 135–145)

## 2020-05-30 LAB — CREATININE, SERUM
Creatinine, Ser: 0.73 mg/dL (ref 0.44–1.00)
GFR calc Af Amer: >60 mL/min (ref >60)
GFR calc non Af Amer: >60 mL/min (ref >60)

## 2020-05-30 LAB — ABO/RH: ABO/RH(D): O POS

## 2020-05-30 SURGERY — CRANIOTOMY HYPOPHYSECTOMY TRANSNASAL APPROACH
Anesthesia: General | Site: Head

## 2020-05-30 MED ORDER — POTASSIUM CHLORIDE 2 MEQ/ML IV SOLN
INTRAVENOUS | Status: DC
  Filled 2020-05-30 (×3): qty 10

## 2020-05-30 MED ORDER — THROMBIN 5000 UNITS EX SOLR
CUTANEOUS | Status: AC
  Filled 2020-05-30: qty 5000

## 2020-05-30 MED ORDER — MAGNESIUM CITRATE PO SOLN: 1.0000 | Freq: Once | ORAL | Status: DC | PRN

## 2020-05-30 MED ORDER — HEPARIN SODIUM (PORCINE) 5000 UNIT/ML IJ SOLN
5000.0000 [IU] | Freq: Three times a day (TID) | INTRAMUSCULAR | Status: DC
  Administered 2020-05-31 – 2020-06-01 (×3): 5000 [IU] via SUBCUTANEOUS
  Filled 2020-05-30 (×3): qty 1

## 2020-05-30 MED ORDER — ONDANSETRON HCL 4 MG/2ML IJ SOLN: 4.0000 mg | INTRAMUSCULAR | Status: DC | PRN

## 2020-05-30 MED ORDER — LIDOCAINE 2% (20 MG/ML) 5 ML SYRINGE
INTRAMUSCULAR | Status: DC | PRN
  Administered 2020-05-30: 40 mg via INTRAVENOUS

## 2020-05-30 MED ORDER — MIDAZOLAM HCL 5 MG/5ML IJ SOLN
INTRAMUSCULAR | Status: DC | PRN
  Administered 2020-05-30: 2 mg via INTRAVENOUS

## 2020-05-30 MED ORDER — ROCURONIUM BROMIDE 10 MG/ML (PF) SYRINGE
PREFILLED_SYRINGE | INTRAVENOUS | Status: DC | PRN
  Administered 2020-05-30: 60 mg via INTRAVENOUS
  Administered 2020-05-30: 20 mg via INTRAVENOUS

## 2020-05-30 MED ORDER — VANCOMYCIN HCL IN DEXTROSE 1-5 GM/200ML-% IV SOLN
1000.0000 mg | INTRAVENOUS | Status: AC
  Administered 2020-05-30: 1000 mg via INTRAVENOUS
  Filled 2020-05-30: qty 200

## 2020-05-30 MED ORDER — HEMOSTATIC AGENTS (NO CHARGE) OPTIME
TOPICAL | Status: DC | PRN
  Administered 2020-05-30 (×2): 1 via TOPICAL

## 2020-05-30 MED ORDER — ROCURONIUM BROMIDE 10 MG/ML (PF) SYRINGE
PREFILLED_SYRINGE | INTRAVENOUS | Status: AC
  Filled 2020-05-30: qty 10

## 2020-05-30 MED ORDER — PROPOFOL 10 MG/ML IV BOLUS
INTRAVENOUS | Status: DC | PRN
  Administered 2020-05-30: 150 mg via INTRAVENOUS

## 2020-05-30 MED ORDER — HYDROCODONE-ACETAMINOPHEN 5-325 MG PO TABS: 1.0000 | ORAL_TABLET | ORAL | Status: DC | PRN

## 2020-05-30 MED ORDER — FENTANYL CITRATE (PF) 250 MCG/5ML IJ SOLN
INTRAMUSCULAR | Status: AC
  Filled 2020-05-30: qty 5

## 2020-05-30 MED ORDER — PHENYLEPHRINE HCL-NACL 10-0.9 MG/250ML-% IV SOLN
INTRAVENOUS | Status: DC | PRN
Start: 2020-05-30 — End: 2020-05-30
  Administered 2020-05-30: 20 ug/min via INTRAVENOUS

## 2020-05-30 MED ORDER — LIDOCAINE 2% (20 MG/ML) 5 ML SYRINGE
INTRAMUSCULAR | Status: AC
  Filled 2020-05-30: qty 5

## 2020-05-30 MED ORDER — LABETALOL HCL 5 MG/ML IV SOLN
10.0000 mg | INTRAVENOUS | Status: DC | PRN
  Administered 2020-05-30: 20 mg via INTRAVENOUS
  Administered 2020-05-31: 10 mg via INTRAVENOUS
  Filled 2020-05-30 (×2): qty 4

## 2020-05-30 MED ORDER — OXYCODONE HCL 5 MG/5ML PO SOLN: 5.0000 mg | Freq: Once | ORAL | Status: DC | PRN

## 2020-05-30 MED ORDER — FENTANYL CITRATE (PF) 100 MCG/2ML IJ SOLN: 25.0000 ug | INTRAMUSCULAR | Status: DC | PRN

## 2020-05-30 MED ORDER — PROPOFOL 10 MG/ML IV BOLUS
INTRAVENOUS | Status: AC
  Filled 2020-05-30: qty 20

## 2020-05-30 MED ORDER — ONDANSETRON HCL 4 MG/2ML IJ SOLN: 4.0000 mg | Freq: Once | INTRAMUSCULAR | Status: DC | PRN

## 2020-05-30 MED ORDER — OXYMETAZOLINE HCL 0.05 % NA SOLN
NASAL | Status: AC
  Filled 2020-05-30: qty 30

## 2020-05-30 MED ORDER — BISACODYL 5 MG PO TBEC: 5.0000 mg | DELAYED_RELEASE_TABLET | Freq: Every day | ORAL | Status: DC | PRN

## 2020-05-30 MED ORDER — HYDRALAZINE HCL 20 MG/ML IJ SOLN
INTRAMUSCULAR | Status: AC
  Filled 2020-05-30: qty 1

## 2020-05-30 MED ORDER — LIDOCAINE-EPINEPHRINE 0.5 %-1:200000 IJ SOLN
INTRAMUSCULAR | Status: AC
  Filled 2020-05-30: qty 1

## 2020-05-30 MED ORDER — BACITRACIN ZINC 500 UNIT/GM EX OINT
TOPICAL_OINTMENT | CUTANEOUS | Status: AC
  Filled 2020-05-30: qty 28.35

## 2020-05-30 MED ORDER — ONDANSETRON HCL 4 MG/2ML IJ SOLN
INTRAMUSCULAR | Status: DC | PRN
  Administered 2020-05-30: 4 mg via INTRAVENOUS

## 2020-05-30 MED ORDER — SENNOSIDES-DOCUSATE SODIUM 8.6-50 MG PO TABS: 1.0000 | ORAL_TABLET | Freq: Every evening | ORAL | Status: DC | PRN

## 2020-05-30 MED ORDER — NICARDIPINE HCL IN NACL 20-0.86 MG/200ML-% IV SOLN
5.0000 mg/h | INTRAVENOUS | Status: DC
  Administered 2020-05-30: 5 mg/h via INTRAVENOUS

## 2020-05-30 MED ORDER — 0.9 % SODIUM CHLORIDE (POUR BTL) OPTIME
TOPICAL | Status: DC | PRN
  Administered 2020-05-30: 1000 mL

## 2020-05-30 MED ORDER — FENTANYL CITRATE (PF) 250 MCG/5ML IJ SOLN
INTRAMUSCULAR | Status: DC | PRN
  Administered 2020-05-30 (×4): 50 ug via INTRAVENOUS

## 2020-05-30 MED ORDER — MORPHINE SULFATE (PF) 2 MG/ML IV SOLN: 1.0000 mg | INTRAVENOUS | Status: DC | PRN

## 2020-05-30 MED ORDER — LACTATED RINGERS IV SOLN: INTRAVENOUS | Status: DC | PRN

## 2020-05-30 MED ORDER — PANTOPRAZOLE SODIUM 40 MG IV SOLR
40.0000 mg | Freq: Every day | INTRAVENOUS | Status: DC
  Administered 2020-05-30 – 2020-05-31 (×2): 40 mg via INTRAVENOUS
  Filled 2020-05-30 (×2): qty 40

## 2020-05-30 MED ORDER — ONDANSETRON HCL 4 MG PO TABS: 4.0000 mg | ORAL_TABLET | ORAL | Status: DC | PRN

## 2020-05-30 MED ORDER — METHYLPREDNISOLONE SODIUM SUCC 125 MG IJ SOLR
INTRAMUSCULAR | Status: DC | PRN
Start: 2020-05-30 — End: 2020-05-30
  Administered 2020-05-30: 125 mg via INTRAVENOUS

## 2020-05-30 MED ORDER — LIDOCAINE-EPINEPHRINE 1 %-1:100000 IJ SOLN
INTRAMUSCULAR | Status: AC
  Filled 2020-05-30: qty 1

## 2020-05-30 MED ORDER — CHLORHEXIDINE GLUCONATE CLOTH 2 % EX PADS
6.0000 | MEDICATED_PAD | Freq: Every day | CUTANEOUS | Status: DC
  Administered 2020-05-30 – 2020-06-01 (×2): 6 via TOPICAL

## 2020-05-30 MED ORDER — NALOXONE HCL 0.4 MG/ML IJ SOLN: 0.0800 mg | INTRAMUSCULAR | Status: DC | PRN

## 2020-05-30 MED ORDER — PROMETHAZINE HCL 25 MG PO TABS: 12.5000 mg | ORAL_TABLET | ORAL | Status: DC | PRN

## 2020-05-30 MED ORDER — NICARDIPINE HCL IN NACL 20-0.86 MG/200ML-% IV SOLN
INTRAVENOUS | Status: AC
  Filled 2020-05-30: qty 200

## 2020-05-30 MED ORDER — CHLORHEXIDINE GLUCONATE CLOTH 2 % EX PADS: 6.0000 | MEDICATED_PAD | Freq: Once | CUTANEOUS | Status: DC

## 2020-05-30 MED ORDER — OXYMETAZOLINE HCL 0.05 % NA SOLN
2.0000 | NASAL | Status: DC
  Administered 2020-05-30: 2 via NASAL
  Filled 2020-05-30: qty 30

## 2020-05-30 MED ORDER — HYDRALAZINE HCL 20 MG/ML IJ SOLN
5.0000 mg | Freq: Once | INTRAMUSCULAR | Status: AC
  Administered 2020-05-30: 5 mg via INTRAVENOUS

## 2020-05-30 MED ORDER — MIDAZOLAM HCL 2 MG/2ML IJ SOLN
INTRAMUSCULAR | Status: AC
  Filled 2020-05-30: qty 2

## 2020-05-30 MED ORDER — LIDOCAINE-EPINEPHRINE 1 %-1:100000 IJ SOLN
INTRAMUSCULAR | Status: DC | PRN
  Administered 2020-05-30: 5 mL

## 2020-05-30 MED ORDER — CHLORHEXIDINE GLUCONATE 0.12 % MT SOLN
15.0000 mL | Freq: Once | OROMUCOSAL | Status: AC
  Administered 2020-05-30: 15 mL via OROMUCOSAL
  Filled 2020-05-30: qty 15

## 2020-05-30 MED ORDER — ORAL CARE MOUTH RINSE: 15.0000 mL | Freq: Once | OROMUCOSAL | Status: AC

## 2020-05-30 MED ORDER — OXYCODONE HCL 5 MG PO TABS: 5.0000 mg | ORAL_TABLET | Freq: Once | ORAL | Status: DC | PRN

## 2020-05-30 MED ORDER — DOCUSATE SODIUM 100 MG PO CAPS
100.0000 mg | ORAL_CAPSULE | Freq: Two times a day (BID) | ORAL | Status: DC
  Administered 2020-05-31 (×2): 100 mg via ORAL
  Filled 2020-05-30 (×2): qty 1

## 2020-05-30 MED ORDER — LACTATED RINGERS IV SOLN: INTRAVENOUS | Status: DC

## 2020-05-30 MED ORDER — LATANOPROST 0.005 % OP SOLN
1.0000 [drp] | Freq: Every day | OPHTHALMIC | Status: DC
  Administered 2020-05-30 – 2020-05-31 (×2): 1 [drp] via OPHTHALMIC
  Filled 2020-05-30: qty 2.5

## 2020-05-30 MED ORDER — SUGAMMADEX SODIUM 200 MG/2ML IV SOLN
INTRAVENOUS | Status: DC | PRN
  Administered 2020-05-30: 150 mg via INTRAVENOUS

## 2020-05-30 MED ORDER — THROMBIN 5000 UNITS EX SOLR
CUTANEOUS | Status: DC | PRN
  Administered 2020-05-30 (×2): 5000 [IU] via TOPICAL

## 2020-05-30 MED ORDER — BACITRACIN ZINC 500 UNIT/GM EX OINT
TOPICAL_OINTMENT | CUTANEOUS | Status: DC | PRN
  Administered 2020-05-30: 1 via TOPICAL

## 2020-05-30 MED ORDER — VANCOMYCIN HCL IN DEXTROSE 1-5 GM/200ML-% IV SOLN
INTRAVENOUS | Status: AC
  Filled 2020-05-30: qty 200

## 2020-05-30 SURGICAL SUPPLY — 54 items
BAND RUBBER #18 3X1/16 STRL (MISCELLANEOUS) ×2 IMPLANT
BLADE SURG 15 STRL LF DISP TIS (BLADE) IMPLANT
BLADE SURG 15 STRL SS (BLADE) ×1
BLADE TRICUT ROTATE M4 4 5PK (BLADE) ×1 IMPLANT
BUR MATCHSTICK NEURO 3.0 LAGG (BURR) ×1 IMPLANT
CANISTER SUCT 3000ML PPV (MISCELLANEOUS) ×4 IMPLANT
CARTRIDGE OIL MAESTRO DRILL (MISCELLANEOUS) ×1 IMPLANT
CATH ROBINSON RED A/P 14FR (CATHETERS) IMPLANT
COTTONBALL LRG STERILE PKG (GAUZE/BANDAGES/DRESSINGS) IMPLANT
COVER WAND RF STERILE (DRAPES) ×2 IMPLANT
DIFFUSER DRILL AIR PNEUMATIC (MISCELLANEOUS) ×2 IMPLANT
DRAPE C-ARM 42X72 X-RAY (DRAPES) ×2 IMPLANT
DRAPE MICROSCOPE LEICA (MISCELLANEOUS) ×1 IMPLANT
DRESSING NASAL KENNEDY 3.5X.9 (MISCELLANEOUS) IMPLANT
DRSG NASAL KENNEDY 3.5X.9 (MISCELLANEOUS)
DURAPREP 26ML APPLICATOR (WOUND CARE) ×2 IMPLANT
ELECT COATED BLADE 2.86 ST (ELECTRODE) ×1 IMPLANT
ELECT NDL TIP 2.8 STRL (NEEDLE) IMPLANT
ELECT NEEDLE TIP 2.8 STRL (NEEDLE) IMPLANT
ELECT REM PT RETURN 9FT ADLT (ELECTROSURGICAL) ×2
ELECTRODE REM PT RTRN 9FT ADLT (ELECTROSURGICAL) ×1 IMPLANT
GAUZE PACKING FOLDED 2  STR (GAUZE/BANDAGES/DRESSINGS)
GAUZE PACKING FOLDED 2 STR (GAUZE/BANDAGES/DRESSINGS) IMPLANT
GAUZE SPONGE 4X4 12PLY STRL (GAUZE/BANDAGES/DRESSINGS) ×2 IMPLANT
GLOVE ECLIPSE 6.5 STRL STRAW (GLOVE) ×3 IMPLANT
GLOVE ECLIPSE 7.5 STRL STRAW (GLOVE) ×4 IMPLANT
GOWN STRL REUS W/ TWL LRG LVL3 (GOWN DISPOSABLE) ×2 IMPLANT
GOWN STRL REUS W/ TWL XL LVL3 (GOWN DISPOSABLE) IMPLANT
GOWN STRL REUS W/TWL LRG LVL3 (GOWN DISPOSABLE) ×2
GOWN STRL REUS W/TWL XL LVL3 (GOWN DISPOSABLE) ×2
HEMOSTAT SURGICEL 2X14 (HEMOSTASIS) ×1 IMPLANT
KIT BASIN OR (CUSTOM PROCEDURE TRAY) ×2 IMPLANT
KIT TURNOVER KIT B (KITS) ×2 IMPLANT
KNIFE ARACHNOID DISP AM-23-XSB (BLADE) ×1 IMPLANT
KNIFE ARACHNOID DISP AM-24-SB (BLADE) ×1 IMPLANT
NDL HYPO 25X1 1.5 SAFETY (NEEDLE) ×1 IMPLANT
NDL PRECISIONGLIDE 27X1.5 (NEEDLE) ×1 IMPLANT
NDL SPNL 22GX3.5 QUINCKE BK (NEEDLE) ×1 IMPLANT
NDL SPNL 25GX3.5 QUINCKE BL (NEEDLE) IMPLANT
NEEDLE HYPO 25X1 1.5 SAFETY (NEEDLE) ×2 IMPLANT
NEEDLE PRECISIONGLIDE 27X1.5 (NEEDLE) ×2 IMPLANT
NEEDLE SPNL 22GX3.5 QUINCKE BK (NEEDLE) ×2 IMPLANT
NEEDLE SPNL 25GX3.5 QUINCKE BL (NEEDLE) ×2 IMPLANT
NS IRRIG 1000ML POUR BTL (IV SOLUTION) ×2 IMPLANT
OIL CARTRIDGE MAESTRO DRILL (MISCELLANEOUS) ×2
PACK LAMINECTOMY NEURO (CUSTOM PROCEDURE TRAY) ×2 IMPLANT
PATTIES SURGICAL .5 X3 (DISPOSABLE) ×2 IMPLANT
PENCIL FOOT CONTROL (ELECTRODE) ×1 IMPLANT
SPONGE SURGIFOAM ABS GEL SZ50 (HEMOSTASIS) ×1 IMPLANT
STRIP CLOSURE SKIN 1/2X4 (GAUZE/BANDAGES/DRESSINGS) IMPLANT
TOWEL GREEN STERILE (TOWEL DISPOSABLE) ×2 IMPLANT
TOWEL GREEN STERILE FF (TOWEL DISPOSABLE) ×2 IMPLANT
TRAY FOLEY MTR SLVR 16FR STAT (SET/KITS/TRAYS/PACK) ×2 IMPLANT
WATER STERILE IRR 1000ML POUR (IV SOLUTION) ×3 IMPLANT

## 2020-05-30 NOTE — H&P (Signed)
HPI:   Kristen Williamson is a 44 y.o. female who presents as a consult Patient.   Referring Provider: Corine Shelter  Chief complaint: Pituitary tumor.  HPI: History of transnasal pituitary resection in [place] few years ago. Found to have a recurrent mass, recent MRI revealed enlargement of that mass. Initially she had visual field defect but that has recovered since her first operation and she currently does not have any symptoms. She had COVID-19 recently. She was hospitalized for 5 days. Otherwise in good health.  PMH/Meds/All/SocHx/FamHx/ROS:   Past Medical History:  Diagnosis Date  . Cancer ([place])  . History of 2019 novel coronavirus disease (COVID-19)   Past Surgical History:  Procedure Laterality Date  . BRAIN SURGERY 2018   No family history of bleeding disorders, wound healing problems or difficulty with anesthesia.   Social History   Socioeconomic History  . Marital status: Married  Spouse name: Not on file  . Number of children: Not on file  . Years of education: Not on file  . Highest education level: Not on file  Occupational History  . Not on file  Tobacco Use  . Smoking status: Former Smoker  Types: Cigars  . Smokeless tobacco: Never Used  Substance and Sexual Activity  . Alcohol use: Yes  . Drug use: Never  . Sexual activity: Not on file  Other Topics Concern  . Not on file  Social History Narrative  . Not on file   Social Determinants of Health   Financial Resource Strain:  . Difficulty of Paying Living Expenses:  Food Insecurity:  . Worried About Charity fundraiser in the Last Year:  . Arboriculturist in the Last Year:  Transportation Needs:  . Film/video editor (Medical):  Marland Kitchen Lack of Transportation (Non-Medical):  Physical Activity:  . Days of Exercise per Week:  . Minutes of Exercise per Session:  Stress:  . Feeling of Stress :  Social Connections:  . Frequency of Communication with Friends and Family:  .  Frequency of Social Gatherings with Friends and Family:  . Attends Religious Services:  . Active Member of Clubs or Organizations:  . Attends Archivist Meetings:  Marland Kitchen Marital Status:   Current Outpatient Medications:  . predniSONE (DELTASONE) 2.5 MG tablet, Take 2.5 mg by mouth., Disp: , Rfl:   A complete ROS was performed with pertinent positives/negatives noted in the HPI. The remainder of the ROS are negative.   Physical Exam:   Temp 96.9 F (36.1 C)  Ht 1.778 m (5\' 10" )  Wt 72.6 kg (160 lb)  BMI 22.96 kg/m   General: Healthy and alert, in no distress, breathing easily. Normal affect. In a pleasant mood. Head: Normocephalic, atraumatic. No masses, or scars. Eyes: Pupils are equal, and reactive to light. Vision is grossly intact. No spontaneous or gaze nystagmus. Ears: Ear canals are clear. Tympanic membranes are intact, with normal landmarks and the middle ears are clear and healthy. Hearing: Grossly normal. Nose: Nasal cavities are clear with healthy mucosa, no polyps or exudate. Airways are patent. Face: No masses or scars, facial nerve function is symmetric. Oral Cavity: No mucosal abnormalities are noted. Tongue with normal mobility. Dentition appears healthy. Oropharynx: Tonsils are symmetric. There are no mucosal masses identified. Tongue base appears normal and healthy. Larynx/Hypopharynx: deferred Chest: Deferred Neck: No palpable masses, no cervical adenopathy, no thyroid nodules or enlargement. Neuro: Cranial nerves II-XII with normal function. Balance: Normal gate. Other findings: none.  Independent Review  of Additional Tests or Records:  none  Procedures:  none  Impression & Plans:  Normal head and neck exam. Recent MRI reveals a 3.4 cm pituitary mass. I will coordinate with Dr. Hewitt Shorts so that we can plan combined surgery for this with a transseptal approach. If there is an interest in endoscopic approach then we can refer her over to Dr.  Wilburn Cornelia.

## 2020-05-30 NOTE — Transfer of Care (Signed)
Immediate Anesthesia Transfer of Care Note  Patient: Kristen Williamson  Procedure(s) Performed: Transphenoidal Resection of Tumor (N/A Head) TRANSPHENOIDAL APPROACH EXPOSURE (N/A Head)  Patient Location: PACU  Anesthesia Type:General  Level of Consciousness: awake, alert  and oriented  Airway & Oxygen Therapy: Patient Spontanous Breathing  Post-op Assessment: Report given to RN, Post -op Vital signs reviewed and stable and Patient moving all extremities  Post vital signs: Reviewed and stable  Last Vitals:  Vitals Value Taken Time  BP 140/90 05/30/20 1324  Temp    Pulse 56 05/30/20 1326  Resp 10 05/30/20 1326  SpO2 97 % 05/30/20 1326  Vitals shown include unvalidated device data.  Last Pain:  Vitals:   05/30/20 0834  TempSrc:   PainSc: 0-No pain         Complications: No complications documented.

## 2020-05-30 NOTE — H&P (Signed)
BP 125/83   Pulse 76   Temp (!) 97.3 F (36.3 C) (Tympanic)   Resp 18   Ht 5\' 10"  (1.778 m)   Wt 72.6 kg   SpO2 100%   BMI 22.96 kg/m     Kristen Williamson comes in today at the behest of Dr. Nolene Ebbs.  She had a history of a pituitary tumor, which was removed at the  [facility], in  [place], in 2017.  At that time, the paperwork which I had seen does not suggest that this is a secreting tumor.  Kristen Williamson mentions that at some point a pill being taken was considered, but nonetheless she underwent transsphenoidal surgery and did well afterwards.  She said she had a slight bit of urinary incontinence preop, but that resolved.  She also knew that her peripheral vision was certainly compromised, and at this point in time, today it is not.  She is 70 inches, weighs 168 pounds.  Blood pressure is 121/73, pulse is 61, temperature is 97.9.     EXAM :  She is alert and oriented by 4, answers all questions appropriately.  Memory, language, attention span, and fund of knowledge are normal.  She has full visual fields on confrontation.  Pupils equal, round, and reactive to light.  Full extraocular movements.  Hearing is intact to finger rub.  Shoulder shrug is normal.  Tongue protrudes in the midline.  Uvula elevates in the midline.  She has no drift.  Normal strength in the upper and lower extremities.  Reflexes are 2+ biceps, triceps, brachioradialis, knees, and ankles.  She has a normal gait.  Normal muscle tone, bulk, and coordination.        MRI is reviewed.  Significant growth from 2019 until today.  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  It appears cystic and solid, the biggest component being cystic.     I will send Kristen Williamson to undergo pituitary testing.  I will send her to Dr. Wilburn Cornelia for ENT evaluation, and I will also have her seen by Dr. Venetia Maxon for visual fields.  I will see her in the office afterwards, but do plan on doing another surgery.

## 2020-05-30 NOTE — Progress Notes (Signed)
Spoke with IP provider regarding hx of COVID and unable to test. Okay to place in regular ICU room, no negative pressure room needed.

## 2020-05-30 NOTE — Anesthesia Procedure Notes (Signed)
Arterial Line Insertion Start/End6/23/2021 9:45 AM, 05/30/2020 9:55 AM Performed by: Kyung Rudd, CRNA, CRNA  Patient location: Pre-op. Preanesthetic checklist: patient identified, IV checked, site marked, risks and benefits discussed, surgical consent, monitors and equipment checked, pre-op evaluation, timeout performed and anesthesia consent Lidocaine 1% used for infiltration and patient sedated Left, radial was placed Catheter size: 20 G Hand hygiene performed  and maximum sterile barriers used   Attempts: 1 Procedure performed without using ultrasound guided technique. Following insertion, dressing applied and Biopatch. Post procedure assessment: normal  Patient tolerated the procedure well with no immediate complications.

## 2020-05-30 NOTE — Anesthesia Procedure Notes (Signed)
Procedure Name: Intubation Date/Time: 05/30/2020 10:23 AM Performed by: Amadeo Garnet, CRNA Pre-anesthesia Checklist: Emergency Drugs available, Patient identified, Suction available and Patient being monitored Patient Re-evaluated:Patient Re-evaluated prior to induction Oxygen Delivery Method: Circle system utilized Preoxygenation: Pre-oxygenation with 100% oxygen Induction Type: IV induction Ventilation: Mask ventilation without difficulty Laryngoscope Size: Mac and 3 Grade View: Grade I Tube type: Oral Tube size: 7.0 mm Number of attempts: 1 Airway Equipment and Method: Stylet Placement Confirmation: ETT inserted through vocal cords under direct vision,  positive ETCO2 and breath sounds checked- equal and bilateral Secured at: 22 cm Tube secured with: Tape Dental Injury: Teeth and Oropharynx as per pre-operative assessment

## 2020-05-30 NOTE — Op Note (Signed)
OPERATIVE REPORT  DATE OF SURGERY: 05/30/2020  PATIENT:  Kristen Williamson,  44 y.o. female  PRE-OPERATIVE DIAGNOSIS:  Invasive pituitary tumor  POST-OPERATIVE DIAGNOSIS:  Invasive pituitary tumor  PROCEDURE:  Procedure(s): Transphenoidal Resection of Tumor TRANSPHENOIDAL APPROACH EXPOSURE  SURGEON:  Beckie Salts, MD, and Dayton Bailiff, MD  ASSISTANTS: none  ANESTHESIA:   General   EBL: 50 ml  DRAINS: none  LOCAL MEDICATIONS USED:  1% Xylocaine with epinephrine  SPECIMEN: Per neurosurgical report  COUNTS:  Correct  PROCEDURE DETAILS: The patient was taken to the operating room and placed on the operating table in the supine position. Following induction of general endotracheal anesthesia, the nose was prepped and draped in a standard fashion. Afrin spray was used preoperatively in the holding area. 1% Xylocaine with epinephrine was infiltrated into the septum, the columella, and the inferior turbinates bilaterally.  A left hemitransfixion incision was created with a 15 scalpel. A mucoperichondrial flap was developed posteriorly down the left side of the nasal septum using a Cottle elevator. This was performed all the way to the sphenoid rostrum. The bony cartilaginous junction was divided and a similar flap was developed down the right side. The ethmoid plate was mostly resected.  As was the vomer.  Dissection continued down all the way to expose the face of the sphenoid and the posterior septum was removed.  The inferior aspect of the sphenoid rostrum was removed with 4 mm osteotome and Kerrison rongeur.  The Hardy speculum was placed into the nasal septal space and secured in place exposing the sphenoid.  Superiorly the sphenoid was opened from previous surgery.  The tumor was resected by neurosurgery.  There was no CSF leak.  Fat graft was not necessary.  The nasal septal incision was reapproximated with interrupted chromic suture and the flaps were quilted with 4-0 plain  gut.  Bilateral long Slimline Merocel packs were placed coated with bacitracin ointment and saturated with the local anesthetic solution.  The pharynx was suctioned of blood and secretions.   The patient was awakened extubated and transferred to recovery in stable condition.    PATIENT DISPOSITION:  To PACU, stable

## 2020-05-30 NOTE — Anesthesia Postprocedure Evaluation (Signed)
Anesthesia Post Note  Patient: Kryslyn Helbig Purifoy  Procedure(s) Performed: Transphenoidal Resection of Tumor (N/A Head) TRANSPHENOIDAL APPROACH EXPOSURE (N/A Head)     Patient location during evaluation: PACU Anesthesia Type: General Level of consciousness: awake and alert Pain management: pain level controlled Vital Signs Assessment: post-procedure vital signs reviewed and stable Respiratory status: spontaneous breathing, nonlabored ventilation, respiratory function stable and patient connected to nasal cannula oxygen Cardiovascular status: blood pressure returned to baseline and stable Postop Assessment: no apparent nausea or vomiting Anesthetic complications: no   No complications documented.  Last Vitals:  Vitals:   05/30/20 1500 05/30/20 1515  BP: 131/63 132/82  Pulse: 64 66  Resp: 10 12  Temp:    SpO2: 99% 100%    Last Pain:  Vitals:   05/30/20 1515  TempSrc:   PainSc: 0-No pain                 Edyn Qazi COKER

## 2020-05-30 NOTE — Op Note (Signed)
BP 125/83   Pulse 76   Temp (!) (P) 97.3 F (36.3 C)   Resp 18   Ht 5\' 10"  (1.778 m)   Wt 72.6 kg   SpO2 100%   BMI 22.96 kg/m  05/30/2020  1:32 PM  PATIENT:  Kristen Williamson  44 y.o. female  PRE-OPERATIVE DIAGNOSIS:  Invasive pituitary tumor  POST-OPERATIVE DIAGNOSIS:  Invasive pituitary tumor  PROCEDURE:  Procedure(s): Transphenoidal Resection of Tumor with microscopic dissection TRANSPHENOIDAL APPROACH EXPOSURE  SURGEON: Surgeon(s): Ashok Pall, MD Izora Gala, MD  ASSISTANTS:none  ANESTHESIA:   general  EBL:  Total I/O In: 1300 [I.V.:1300] Out: 260 [Urine:250; Blood:10]  BLOOD ADMINISTERED:none  CELL SAVER GIVEN:none  COUNT:per nursing  DRAINS: none   SPECIMEN:  Source of Specimen:  sella turcia  DICTATION: Kristen Williamson was taken to the operating room, intubated, and placed under a general anesthetic without difficulty. She was positioned on a horseshoe head rest. She was prepped and draped in a sterile manner.  Under separate cover Dr. Constance Holster will dictate his approach and closure.  Once we were able to confirm the we were in the sella, I used a scalpel to open the durA. The sellar opening was covered by a medpor graft. I removed the graft, then opened the dura. The dura was very thick, but eventually I gained entrance to the pituitary gland. I removed tissue and sent some to pathology. After opening the dura golden clearish fluid came out of the opening. This was consistent with the imaging which showed a cystic mass. It was not csf as the fluid eventually stopped. Through the microscope I observed the diaphragma sella.  The layer was pulsatile.  I satisfied  Myself that I had done the resection and turned it over to Dr. Constance Holster to close.  PLAN OF CARE: Admit to inpatient   PATIENT DISPOSITION:  PACU - hemodynamically stable.   Delay start of Pharmacological VTE agent (>24hrs) due to surgical blood loss or risk of bleeding:  yes

## 2020-05-31 ENCOUNTER — Encounter (HOSPITAL_COMMUNITY): Payer: Self-pay | Admitting: Neurosurgery

## 2020-05-31 MED ORDER — WHITE PETROLATUM EX OINT
TOPICAL_OINTMENT | CUTANEOUS | Status: AC
  Administered 2020-05-31: 0.2
  Filled 2020-05-31: qty 28.35

## 2020-05-31 MED ORDER — HYDROCORTISONE 20 MG PO TABS
50.0000 mg | ORAL_TABLET | Freq: Two times a day (BID) | ORAL | Status: AC
  Administered 2020-05-31 (×2): 50 mg via ORAL
  Filled 2020-05-31 (×2): qty 1

## 2020-05-31 NOTE — Progress Notes (Signed)
Patient ID: Kristen Williamson, female   DOB: [birth year], 44 y.o.   MRN: [id number] BP 131/78   Pulse 81   Temp 100 F (37.8 C) (Oral)   Resp 10   Ht 5\' 10"  (1.778 m)   Wt 72.6 kg   SpO2 98%   BMI 22.96 kg/m  Alert and oriented x 4, vision is good, visual fields are intact. Perrl, full eom Moving all extremities well Would like the nasal packs out, not possible at this time Doing well overall.

## 2020-06-01 LAB — GLUCOSE, CAPILLARY: Glucose-Capillary: 135 mg/dL — ABNORMAL HIGH (ref 70–99)

## 2020-06-01 MED ORDER — HYDROCORTISONE 20 MG PO TABS
20.0000 mg | ORAL_TABLET | Freq: Three times a day (TID) | ORAL | Status: DC
  Administered 2020-06-01 (×2): 20 mg via ORAL
  Filled 2020-06-01 (×3): qty 1

## 2020-06-01 NOTE — Progress Notes (Signed)
Patient ID: Kristen Williamson, female   DOB: 01-26-1976, 44 y.o.   MRN: [id number] She has done well since surgery.  She is awake and alert.  Packing is in place without any bleeding.  No external swelling.  Plan is to remove packing on Monday either in the hospital or as an outpatient.

## 2020-06-01 NOTE — Discharge Instructions (Signed)
Call if your temperature is greater than 101.5 Call if your urine output seems very large Call if you feel unusually tired Call if you have significant bleeding from your nose, or develop clear fluid leaking from your nose [phone]

## 2020-06-01 NOTE — Discharge Summary (Signed)
Physician Discharge Summary  Patient ID: Kristen Williamson MRN: [id number] DOB/AGE: Aug 30, 1976 44 y.o.  Admit date: 05/30/2020 Discharge date: 06/01/2020  Admission Diagnoses:pituitary tumor  Discharge Diagnoses:  Active Problems:   Pituitary tumor   Discharged Condition: good  Hospital Course: [name] was admitted and taken to the operating room for a transsphenoidal pituitary tumor resection(redo). Post op she is doing well. There are no changes to her visual fields or to her eyesight. She is ambulating, voiding, and tolerating a regular diet. She will be discharged with hydrocortisone and oxycodone.  Treatments: surgery: as above  Discharge Exam: Blood pressure 133/79, pulse 72, temperature 98.5 F (36.9 C), temperature source Oral, resp. rate 16, height 5\' 10"  (1.778 m), weight 72.6 kg, SpO2 99 %. General appearance: alert, cooperative, appears stated age and no distress  Disposition: Discharge disposition: 01-Home or Self Care      Invasive pituitary tumor  Allergies as of 06/01/2020      Reactions   Penicillins Other (See Comments)   Reaction not recalled by the patient      Medication List    TAKE these medications   bimatoprost 0.01 % Soln Commonly known as: LUMIGAN Place 1 drop into both eyes at bedtime.       Follow-up Information    Izora Gala, MD. Schedule an appointment as soon as possible for a visit on 06/04/2020.   Specialty: Otolaryngology Contact information: [address] [place] [address] [phone]        Ashok Pall, MD Follow up in 2 week(s).   Specialty: Neurosurgery Why: please call to make an appointment in 2 weeks. please tell whomever makes the appointment that you will need a Cortisol lab drawn the morning of your appointment day.  Contact information: [address] [place] [address] [phone]               Signed: Ashok Pall 06/01/2020, 8:00  PM

## 2020-06-04 LAB — SURGICAL PATHOLOGY

## 2020-06-04 LAB — AEROBIC/ANAEROBIC CULTURE W GRAM STAIN (SURGICAL/DEEP WOUND)

## 2020-07-08 ENCOUNTER — Encounter (HOSPITAL_COMMUNITY): Payer: Self-pay

## 2020-07-08 ENCOUNTER — Other Ambulatory Visit: Payer: Self-pay

## 2020-07-08 ENCOUNTER — Ambulatory Visit (HOSPITAL_COMMUNITY)
Admission: EM | Admit: 2020-07-08 | Discharge: 2020-07-08 | Disposition: A | Discharge status: Home / Self Care | Payer: Medicaid Other | Attending: Urgent Care | Admitting: Urgent Care

## 2020-07-08 DIAGNOSIS — H60501 Unspecified acute noninfective otitis externa, right ear: Secondary | ICD-10-CM

## 2020-07-08 DIAGNOSIS — H9203 Otalgia, bilateral: Secondary | ICD-10-CM | POA: Diagnosis not present

## 2020-07-08 MED ORDER — POLYMYXIN B-TRIMETHOPRIM 10000-0.1 UNIT/ML-% OP SOLN
4.0000 [drp] | OPHTHALMIC | 0 refills | Status: DC
Start: 2020-07-08 — End: 2021-03-12

## 2020-07-08 MED ORDER — NAPROXEN 500 MG PO TABS
500.0000 mg | ORAL_TABLET | Freq: Two times a day (BID) | ORAL | 0 refills | Status: DC
Start: 2020-07-08 — End: 2021-03-12

## 2020-07-08 MED ORDER — PSEUDOEPHEDRINE HCL 60 MG PO TABS
60.0000 mg | ORAL_TABLET | Freq: Three times a day (TID) | ORAL | 0 refills | Status: DC | PRN
Start: 2020-07-08 — End: 2021-03-12

## 2020-07-08 MED ORDER — CETIRIZINE HCL 10 MG PO TABS: 10.0000 mg | ORAL_TABLET | Freq: Every day | ORAL | 0 refills | Status: DC

## 2020-07-08 NOTE — ED Triage Notes (Signed)
Pt c/o 8/10 sharp pain in ears bilatx3 days. Pt states got worse 2 days ago. Pt states ears also feel swollen.

## 2020-07-08 NOTE — ED Provider Notes (Signed)
[facility]   MRN: [id number] DOB: 01/25/1976  Subjective:   Kristen Williamson is a 44 y.o. female presenting for 3-day history of acute onset worsening bilateral ear pain. Patient has had fullness in both of her areas and feels like they are swollen. She did go swimming recently and thereafter her symptoms started. States that she used a Q-tip to try to put Vaseline in her right ear which made her symptoms worse. Denies dizziness, tinnitus, ear drainage. Denies fever, sore throat, runny stuffy nose, cough, chest pain, shortness of breath.  No current facility-administered medications for this encounter.  Current Outpatient Medications:  .  bimatoprost (LUMIGAN) 0.01 % SOLN, Place 1 drop into both eyes at bedtime., Disp: , Rfl:    Allergies  Allergen Reactions  . Penicillins Other (See Comments)    Reaction not recalled by the patient    Past Medical History:  Diagnosis Date  . Pituitary tumor      Past Surgical History:  Procedure Laterality Date  . ABDOMINAL HYSTERECTOMY    . birth mark removed    . CRANIOTOMY N/A 05/30/2020   Procedure: Transphenoidal Resection of Tumor;  Surgeon: Ashok Pall, MD;  Location: [facility];  Service: Neurosurgery;  Laterality: N/A;  Transphenoidal Resection of Tumor  . TRANSPHENOIDAL APPROACH EXPOSURE N/A 05/30/2020   Procedure: TRANSPHENOIDAL APPROACH EXPOSURE;  Surgeon: Izora Gala, MD;  Location: [facility];  Service: ENT;  Laterality: N/A;  TRANSPHENOIDAL APPROACH EXPOSURE    No family history on file.  Social History   Tobacco Use  . Smoking status: Former Smoker    Quit date: 2021    Years since quitting: 0.5  . Smokeless tobacco: Never Used  Vaping Use  . Vaping Use: Some days  Substance Use Topics  . Alcohol use: Yes    Comment: occasional  . Drug use: No    ROS   Objective:   Vitals: BP (!) 134/73   Pulse 69   Temp 98.9 F (37.2 C) (Oral)   Resp 16   Ht 5\' 10"  (1.778 m)   Wt 168 lb (76.2 kg)   BMI 24.11  kg/m   Physical Exam Constitutional:      General: She is not in acute distress.    Appearance: Normal appearance. She is well-developed. She is not ill-appearing.  HENT:     Head: Normocephalic and atraumatic.     Right Ear: Ear canal normal. No drainage or tenderness. No middle ear effusion. Tympanic membrane is not erythematous.     Left Ear: Ear canal and external ear normal. No drainage or tenderness.  No middle ear effusion. There is no impacted cerumen. Tympanic membrane is not erythematous.     Ears:     Comments: TMs opacified bilaterally. Right ear canal patent but has thick white clumpy discharge. Tenderness endorsed on exam using otoscope.    Nose: Nose normal. No congestion or rhinorrhea.     Mouth/Throat:     Mouth: Mucous membranes are moist. No oral lesions.     Pharynx: No pharyngeal swelling, oropharyngeal exudate, posterior oropharyngeal erythema or uvula swelling.     Tonsils: No tonsillar exudate or tonsillar abscesses.  Eyes:     General: No scleral icterus.       Right eye: No discharge.        Left eye: No discharge.     Extraocular Movements: Extraocular movements intact.     Right eye: Normal extraocular motion.     Left eye: Normal extraocular  motion.     Conjunctiva/sclera: Conjunctivae normal.     Pupils: Pupils are equal, round, and reactive to light.     Comments: Conjunctive are mildly injected bilaterally.  Cardiovascular:     Rate and Rhythm: Normal rate.  Pulmonary:     Effort: Pulmonary effort is normal.  Musculoskeletal:     Cervical back: Normal range of motion and neck supple.  Lymphadenopathy:     Cervical: No cervical adenopathy.  Skin:    General: Skin is warm and dry.  Neurological:     General: No focal deficit present.     Mental Status: She is alert and oriented to person, place, and time.  Psychiatric:        Mood and Affect: Mood normal.        Behavior: Behavior normal.        Thought Content: Thought content normal.         Judgment: Judgment normal.     Assessment and Plan :   PDMP not reviewed this encounter.  1. Acute ear pain, bilateral   2. Acute otitis externa of right ear, unspecified type     Recommended Polytrim for otitis externa of the right ear. Use supportive care otherwise including naproxen for pain and inflammation, Zyrtec and Sudafed for decongestant properties. Counseled patient on potential for adverse effects with medications prescribed/recommended today, ER and return-to-clinic precautions discussed, patient verbalized understanding.    Jaynee Eagles, PA-C 07/08/20 1558

## 2021-03-12 ENCOUNTER — Ambulatory Visit
Admission: EM | Admit: 2021-03-12 | Discharge: 2021-03-12 | Disposition: A | Discharge status: Home / Self Care | Payer: Medicaid Other | Attending: Student | Admitting: Student

## 2021-03-12 ENCOUNTER — Emergency Department (HOSPITAL_COMMUNITY)
Admission: EM | Admit: 2021-03-12 | Discharge: 2021-03-12 | Disposition: A | Discharge status: Home / Self Care | Payer: Medicaid Other | Attending: Emergency Medicine | Admitting: Emergency Medicine

## 2021-03-12 ENCOUNTER — Encounter: Payer: Self-pay | Admitting: Emergency Medicine

## 2021-03-12 ENCOUNTER — Other Ambulatory Visit: Payer: Self-pay

## 2021-03-12 DIAGNOSIS — Z86018 Personal history of other benign neoplasm: Secondary | ICD-10-CM | POA: Diagnosis not present

## 2021-03-12 DIAGNOSIS — K0889 Other specified disorders of teeth and supporting structures: Secondary | ICD-10-CM | POA: Diagnosis present

## 2021-03-12 DIAGNOSIS — Z87891 Personal history of nicotine dependence: Secondary | ICD-10-CM | POA: Diagnosis not present

## 2021-03-12 DIAGNOSIS — R9431 Abnormal electrocardiogram [ECG] [EKG]: Secondary | ICD-10-CM

## 2021-03-12 DIAGNOSIS — E860 Dehydration: Secondary | ICD-10-CM | POA: Diagnosis not present

## 2021-03-12 DIAGNOSIS — K047 Periapical abscess without sinus: Secondary | ICD-10-CM | POA: Diagnosis not present

## 2021-03-12 LAB — CBC WITH DIFFERENTIAL/PLATELET
Abs Immature Granulocytes: 0.01 10*3/uL (ref 0.00–0.07)
Basophils Absolute: 0 10*3/uL (ref 0.0–0.1)
Basophils Relative: 0 %
Eosinophils Absolute: 0.1 10*3/uL (ref 0.0–0.5)
Eosinophils Relative: 2 %
HCT: 38.5 % (ref 36.0–46.0)
Hemoglobin: 12.5 g/dL (ref 12.0–15.0)
Immature Granulocytes: 0 %
Lymphocytes Relative: 35 %
Lymphs Abs: 1.8 10*3/uL (ref 0.7–4.0)
MCH: 26.2 pg (ref 26.0–34.0)
MCHC: 32.5 g/dL (ref 30.0–36.0)
MCV: 80.5 fL (ref 80.0–100.0)
Monocytes Absolute: 0.7 10*3/uL (ref 0.1–1.0)
Monocytes Relative: 13 %
Neutro Abs: 2.6 10*3/uL (ref 1.7–7.7)
Neutrophils Relative %: 50 %
Platelets: 192 10*3/uL (ref 150–400)
RBC: 4.78 MIL/uL (ref 3.87–5.11)
RDW: 12 % (ref 11.5–15.5)
WBC: 5.2 10*3/uL (ref 4.0–10.5)
nRBC: 0 % (ref 0.0–0.2)

## 2021-03-12 LAB — BASIC METABOLIC PANEL
Anion gap: 8 (ref 5–15)
BUN: <5 mg/dL — ABNORMAL LOW (ref 6–20)
CO2: 27 mmol/L (ref 22–32)
Calcium: 8.9 mg/dL (ref 8.9–10.3)
Chloride: 100 mmol/L (ref 98–111)
Creatinine, Ser: 0.72 mg/dL (ref 0.44–1.00)
GFR, Estimated: >60 mL/min (ref >60)
Glucose, Bld: 94 mg/dL (ref 70–99)
Potassium: 3.4 mmol/L — ABNORMAL LOW (ref 3.5–5.1)
Sodium: 135 mmol/L (ref 135–145)

## 2021-03-12 LAB — CBG MONITORING, ED: Glucose-Capillary: 95 mg/dL (ref 70–99)

## 2021-03-12 MED ORDER — CLINDAMYCIN HCL 150 MG PO CAPS
300.0000 mg | ORAL_CAPSULE | Freq: Once | ORAL | Status: AC
  Administered 2021-03-12: 300 mg via ORAL
  Filled 2021-03-12: qty 2

## 2021-03-12 MED ORDER — CLINDAMYCIN HCL 150 MG PO CAPS
300.0000 mg | ORAL_CAPSULE | Freq: Three times a day (TID) | ORAL | 0 refills | Status: DC
Start: 2021-03-12 — End: 2021-05-05

## 2021-03-12 NOTE — Discharge Instructions (Addendum)
Take the prescribed medication as directed. Try to make sure to continue eating/drinking regularly. Follow-up with your dentist as scheduled. Return to the ED for new or worsening symptoms.

## 2021-03-12 NOTE — ED Provider Notes (Signed)
[facility] EMERGENCY DEPARTMENT Provider Note   CSN: [id number] Arrival date & time: 03/12/21  1201     History Chief Complaint  Patient presents with  . Abscess    Kristen Williamson is a 45 y.o. female.  The history is provided by the patient and medical records.  Abscess   45 year old female with history of pituitary tumor, presenting to the ED from urgent care for dental pain.  States she broke one of her left lower molars about 2 weeks ago, began having pain and irritation about 1 week ago.  Went to urgent care this morning due to some development of swelling along her jawline.  States while there she began getting very flushed in the chair and feeling like she was going to pass out.  She went splashed cold water in her face, she never lost consciousness.  States they did an EKG and told her something was "abnormal" so sent her here.  She has never had any chest pain or shortness of breath.  She does currently have a dentist, they are working on trying to fit her in.  States she did eat some applesauce this morning, but has not been able to tolerate much solid food due to pain with chewing.  Past Medical History:  Diagnosis Date  . Pituitary tumor     Patient Active Problem List   Diagnosis Date Noted  . Pituitary tumor 05/30/2020  . Shock ([place]) 03/28/2020    Past Surgical History:  Procedure Laterality Date  . ABDOMINAL HYSTERECTOMY    . birth mark removed    . CRANIOTOMY N/A 05/30/2020   Procedure: Transphenoidal Resection of Tumor;  Surgeon: Ashok Pall, MD;  Location: [facility];  Service: Neurosurgery;  Laterality: N/A;  Transphenoidal Resection of Tumor  . TRANSPHENOIDAL APPROACH EXPOSURE N/A 05/30/2020   Procedure: TRANSPHENOIDAL APPROACH EXPOSURE;  Surgeon: Izora Gala, MD;  Location: [facility];  Service: ENT;  Laterality: N/A;  TRANSPHENOIDAL APPROACH EXPOSURE     OB History   No obstetric history on file.     No family history on  file.  Social History   Tobacco Use  . Smoking status: Former Smoker    Quit date: 2021    Years since quitting: 1.2  . Smokeless tobacco: Never Used  Vaping Use  . Vaping Use: Some days  Substance Use Topics  . Alcohol use: Yes    Comment: occasional  . Drug use: No    Home Medications Prior to Admission medications   Medication Sig Start Date End Date Taking? Authorizing Provider  cetirizine (ZYRTEC ALLERGY) 10 MG tablet Take 1 tablet (10 mg total) by mouth daily. 07/08/20 03/12/21  Jaynee Eagles, PA-C    Allergies    Penicillins  Review of Systems   Review of Systems  HENT: Positive for dental problem.   Neurological: Positive for light-headedness.  All other systems reviewed and are negative.   Physical Exam Updated Vital Signs BP 105/71   Pulse 74   Temp 99.1 F (37.3 C)   Resp 15   SpO2 97%   Physical Exam Vitals and nursing note reviewed.  Constitutional:      Appearance: She is well-developed.  HENT:     Head: Normocephalic and atraumatic.     Mouth/Throat:     Comments: Teeth largely in fair dentition, left lower first molar with central decay and tooth is fractured, surrounding gingiva erythematous and swollen, handling secretions appropriately, no trismus, swelling noted along left lower jaw  but no extension into the neck Eyes:     Conjunctiva/sclera: Conjunctivae normal.     Pupils: Pupils are equal, round, and reactive to light.  Cardiovascular:     Rate and Rhythm: Normal rate and regular rhythm.     Heart sounds: Normal heart sounds.  Pulmonary:     Effort: Pulmonary effort is normal.     Breath sounds: Normal breath sounds. No rhonchi.  Abdominal:     General: Bowel sounds are normal.     Palpations: Abdomen is soft.  Musculoskeletal:        General: Normal range of motion.     Cervical back: Normal range of motion.  Skin:    General: Skin is warm and dry.  Neurological:     Mental Status: She is alert and oriented to person, place, and  time.     ED Results / Procedures / Treatments   Labs (all labs ordered are listed, but only abnormal results are displayed) Labs Reviewed  BASIC METABOLIC PANEL - Abnormal; Notable for the following components:      Result Value   Potassium 3.4 (*)    BUN <5 (*)    All other components within normal limits  CBC WITH DIFFERENTIAL/PLATELET  CBG MONITORING, ED    EKG EKG Interpretation  Date/Time:  Tuesday March 12 2021 12:50:16 EDT Ventricular Rate:  69 PR Interval:  178 QRS Duration: 112 QT Interval:  438 QTC Calculation: 469 R Axis:   57 Text Interpretation: Normal sinus rhythm Incomplete right bundle branch block Nonspecific T wave abnormality Prolonged QT Abnormal ECG T wave inversion now present c/w 4/21 Confirmed by Noemi Chapel [phone]) on 03/12/2021 12:55:41 PM   Radiology No results found.  Procedures Procedures   Medications Ordered in ED Medications  clindamycin (CLEOCIN) capsule 300 mg (300 mg Oral Given 03/12/21 1356)    ED Course  I have reviewed the triage vital signs and the nursing notes.  Pertinent labs & imaging results that were available during my care of the patient were reviewed by me and considered in my medical decision making (see chart for details).    MDM Rules/Calculators/A&P  45 year old female presenting to the ED from urgent care for dental pain.  Broke her tooth about 2 weeks ago, some worsening pain over the past week.  While at urgent care she began feeling lightheaded but did not lose consciousness.  She was sent here for further evaluation.  Upon talking with her, it appears she has not been eating very much as she is having difficulty chewing solid foods.  She did eat applesauce this morning.  She is afebrile, non-toxic.  Does have some swelling of left lower jaw that appears to stem from broken and decayed left lower molar.  There is no extension into the neck.  She is handling secretions well, normal phonation without stridor.  Not  clinically concerning for Ludwig's angina.   CBG on arrival here is 95.  Her screening labs are reassuring.  EKG with some nonspecific changes but she denies any recent chest pain or SOB. Her orthostatic vital signs are appropriate.  States she is feeling better at this time.  She was able to take oral antibiotics and has eaten crackers here in the ED. I suspect her lightheadedness is due to reduced PO intake.  At this time, feel she is stable for discharge home.  We will continue clindamycin and have her follow-up with her dentist as scheduled.  She may return here for  any new or acute changes.  Final Clinical Impression(s) / ED Diagnoses Final diagnoses:  Dental infection    Rx / DC Orders ED Discharge Orders         Ordered    clindamycin (CLEOCIN) 150 MG capsule  3 times daily        03/12/21 1416           Larene Pickett, PA-C 03/12/21 1418    Noemi Chapel, MD 03/15/21 1112

## 2021-03-12 NOTE — ED Triage Notes (Signed)
Pt is present today with a c/o a abscess. Pt states that she noticed it yesterday. Pt denies and fever or drainage

## 2021-03-12 NOTE — ED Notes (Signed)
Patient is being discharged from the Urgent Care and sent to the Emergency Department via ems . Per Olene Craven, patient is in need of higher level of care due to hypotension abnormal ekg. Patient is aware and verbalizes understanding of plan of care.  Vitals:   03/12/21 1018  BP: (!) 97/58  Pulse: 82  Resp: 18  Temp: 99.1 F (37.3 C)  SpO2: 96%

## 2021-03-12 NOTE — ED Triage Notes (Signed)
Pt arrived to ED via ems from Urgent care. Pt with c/o dental abscess that started yesterday to left lower jaw line. Pt states she broke a tooth a week ago. Pain is increasing and swelling. At [facility] report pt was bradycardic and EKG was done. Pt HR was in the 70's . Pt denies chest pain, sob and states never had any.

## 2021-03-12 NOTE — ED Provider Notes (Signed)
[facility]    CSN: [id number] Arrival date & time: 03/12/21  0945      History   Chief Complaint Chief Complaint  Patient presents with  . Dental Pain    HPI Kristen Ivens is a 45 y.o. female presenting with dental infection and lightheadedess. Medical history pituitary adenoma, otherwise noncontributory. Notes 24 hours of progressively worsening left lower dental swelling and pain.  Denies fever/chills, foul taste in mouth, discharge from the area.  States she has had issue like this in the past but not for years. Also states she is feeling lightheaded with standing, has not eaten or drink anything today.  No dizziness with sitting, denies chest pain, vertigo, headaches, vision changes, leg swelling, calf pain.  HPI  Past Medical History:  Diagnosis Date  . Pituitary tumor     Patient Active Problem List   Diagnosis Date Noted  . Pituitary tumor 05/30/2020  . Shock ([place]) 03/28/2020    Past Surgical History:  Procedure Laterality Date  . ABDOMINAL HYSTERECTOMY    . birth mark removed    . CRANIOTOMY N/A 05/30/2020   Procedure: Transphenoidal Resection of Tumor;  Surgeon: Ashok Pall, MD;  Location: [facility];  Service: Neurosurgery;  Laterality: N/A;  Transphenoidal Resection of Tumor  . TRANSPHENOIDAL APPROACH EXPOSURE N/A 05/30/2020   Procedure: TRANSPHENOIDAL APPROACH EXPOSURE;  Surgeon: Izora Gala, MD;  Location: [place];  Service: ENT;  Laterality: N/A;  TRANSPHENOIDAL APPROACH EXPOSURE    OB History   No obstetric history on file.      Home Medications    Prior to Admission medications   Medication Sig Start Date End Date Taking? Authorizing Provider  cetirizine (ZYRTEC ALLERGY) 10 MG tablet Take 1 tablet (10 mg total) by mouth daily. 07/08/20 03/12/21  Jaynee Eagles, PA-C    Family History History reviewed. No pertinent family history.  Social History Social History   Tobacco Use  . Smoking status: Former Smoker    Quit date: 2021     Years since quitting: 1.2  . Smokeless tobacco: Never Used  Vaping Use  . Vaping Use: Some days  Substance Use Topics  . Alcohol use: Yes    Comment: occasional  . Drug use: No     Allergies   Penicillins   Review of Systems Review of Systems  Constitutional: Negative for appetite change, chills, fatigue and fever.  HENT: Positive for dental problem. Negative for congestion, sinus pressure, sore throat, trouble swallowing and voice change.   Eyes: Negative for photophobia, pain, discharge, redness, itching and visual disturbance.  Respiratory: Negative for cough, chest tightness and shortness of breath.   Cardiovascular: Negative for chest pain, palpitations and leg swelling.  Gastrointestinal: Negative for abdominal pain, constipation, diarrhea, nausea and vomiting.  Genitourinary: Negative for dysuria, flank pain, frequency and urgency.  Musculoskeletal: Negative for back pain, gait problem, myalgias, neck pain and neck stiffness.  Neurological: Positive for light-headedness (with standing). Negative for dizziness, tremors, seizures, syncope, facial asymmetry, speech difficulty, weakness, numbness and headaches.  Psychiatric/Behavioral: Negative for agitation, decreased concentration, dysphoric mood, hallucinations and suicidal ideas. The patient is not nervous/anxious.   All other systems reviewed and are negative.    Physical Exam Triage Vital Signs ED Triage Vitals  Enc Vitals Group     BP 03/12/21 1018 (!) 97/58     Pulse Rate 03/12/21 1018 82     Resp 03/12/21 1018 18     Temp 03/12/21 1018 99.1 F (37.3 C)  Temp Source 03/12/21 1018 Oral     SpO2 03/12/21 1018 96 %     Weight --      Height --      Head Circumference --      Peak Flow --      Pain Score 03/12/21 1016 5     Pain Loc --      Pain Edu? --      Excl. in [place]? --    No data found.  Updated Vital Signs BP (!) 97/58 (BP Location: Right Arm)   Pulse 82   Temp 99.1 F (37.3 C) (Oral)   Resp  18   SpO2 96%   Visual Acuity Right Eye Distance:   Left Eye Distance:   Bilateral Distance:    Right Eye Near:   Left Eye Near:    Bilateral Near:     Physical Exam Vitals reviewed.  Constitutional:      General: She is not in acute distress.    Appearance: Normal appearance. She is not ill-appearing, toxic-appearing or diaphoretic.  HENT:     Head: Normocephalic and atraumatic.     Jaw: There is normal jaw occlusion. No trismus, tenderness, swelling, pain on movement or malocclusion.     Salivary Glands: Right salivary gland is not diffusely enlarged or tender. Left salivary gland is not diffusely enlarged or tender.     Right Ear: Hearing normal.     Left Ear: Hearing normal.     Nose: Nose normal.     Mouth/Throat:     Lips: Pink.     Mouth: Mucous membranes are moist. No lacerations or oral lesions.     Dentition: Abnormal dentition. Does not have dentures. Dental tenderness and dental caries present.     Tongue: No lesions. Tongue does not deviate from midline.     Palate: No mass.     Pharynx: Oropharynx is clear. Uvula midline. No oropharyngeal exudate or posterior oropharyngeal erythema.     Tonsils: No tonsillar exudate or tonsillar abscesses.      Comments: Poor dentician  Tenderness and swelling lateral lower L gumline surrounding molars  No trismus, drooling, tenderness or swelling of the roof or floor of mouth. Eyes:     Extraocular Movements: Extraocular movements intact.     Pupils: Pupils are equal, round, and reactive to light.  Cardiovascular:     Rate and Rhythm: Normal rate and regular rhythm.     Pulses:          Radial pulses are 2+ on the right side and 2+ on the left side.     Heart sounds: Normal heart sounds.  Pulmonary:     Effort: Pulmonary effort is normal.     Breath sounds: Normal breath sounds.  Abdominal:     Palpations: Abdomen is soft.     Tenderness: There is no abdominal tenderness. There is no guarding or rebound.   Musculoskeletal:     Right lower leg: No edema.     Left lower leg: No edema.  Skin:    General: Skin is warm.     Capillary Refill: Capillary refill takes 2 to 3 seconds.  Neurological:     General: No focal deficit present.     Mental Status: She is alert and oriented to person, place, and time.  Psychiatric:        Mood and Affect: Mood normal.        Behavior: Behavior normal.  Thought Content: Thought content normal.        Judgment: Judgment normal.      UC Treatments / Results  Labs (all labs ordered are listed, but only abnormal results are displayed) Labs Reviewed - No data to display  EKG   Radiology No results found.  Procedures Procedures (including critical care time)  Medications Ordered in UC Medications - No data to display  Initial Impression / Assessment and Plan / UC Course  I have reviewed the triage vital signs and the nursing notes.  Pertinent labs & imaging results that were available during my care of the patient were reviewed by me and considered in my medical decision making (see chart for details).     This patient is a 45 year old female presenting with dental infection and bradycardia/dehydration. Today this pt is afebrile nontachycardic nontachypneic, oxygenating well on room air, no wheezes rhonchi or rales.   Blood pressure 97/58.  EKG with t-wave inversions in multiple lateral leads, changed from past EKG.  I am sending this patient to ED via EMS for further cardiac workup. Patient is in agreement with this treatment plan. Will defer treatment of dental infection to ED provider.  This chart was dictated using voice recognition software, Dragon. Despite the best efforts of this provider to proofread and correct errors, errors may still occur which can change documentation meaning.  Final Clinical Impressions(s) / UC Diagnoses   Final diagnoses:  Dental infection  Dehydration  Nonspecific abnormal electrocardiogram (ECG)  (EKG)   Discharge Instructions   None    ED Prescriptions    None     PDMP not reviewed this encounter.   Hazel Sams, PA-C 03/12/21 1140

## 2021-04-17 ENCOUNTER — Other Ambulatory Visit: Payer: Self-pay | Admitting: Neurosurgery

## 2021-04-29 ENCOUNTER — Encounter (HOSPITAL_COMMUNITY): Payer: Self-pay

## 2021-04-29 NOTE — Progress Notes (Signed)
Surgical Instructions    Your procedure is scheduled on 05/03/21.  Report to [facility] Main Entrance "A" at 06:30 A.M., then check in with the Admitting office.  Call this number if you have problems the morning of surgery:  [phone]   If you have any questions prior to your surgery date call [phone]: Open Monday-Friday 8am-4pm    Remember:  Do not eat or drink after midnight the night before your surgery      Take these medicines the morning of surgery with A SIP OF WATER: NONE     As of today, STOP taking any Aspirin (unless otherwise instructed by your surgeon) Aleve, Naproxen, Ibuprofen, Motrin, Advil, Goody's, BC's, all herbal medications, fish oil, and all vitamins.                     Do not wear jewelry, make up, or nail polish            Do not wear lotions, powders, perfumes, or deodorant.            Do not shave 48 hours prior to surgery.              Do not bring valuables to the hospital.            [facility] is not responsible for any belongings or valuables.  Do NOT Smoke (Tobacco/Vaping) or drink Alcohol 24 hours prior to your procedure If you use a CPAP at night, you may bring all equipment for your overnight stay.   Contacts, glasses, dentures or bridgework may not be worn into surgery, please bring cases for these belongings   For patients admitted to the hospital, discharge time will be determined by your treatment team.   Patients discharged the day of surgery will not be allowed to drive home, and someone needs to stay with them for 24 hours.    Special instructions:    Oral Hygiene is also important to reduce your risk of infection.  Remember - BRUSH YOUR TEETH THE MORNING OF SURGERY WITH YOUR REGULAR TOOTHPASTE   [facility]- Preparing For Surgery  Before surgery, you can play an important role. Because skin is not sterile, your skin needs to be as free of germs as possible. You can reduce the number of germs on your skin by washing  with CHG (chlorahexidine gluconate) Soap before surgery.  CHG is an antiseptic cleaner which kills germs and bonds with the skin to continue killing germs even after washing.     Please do not use if you have an allergy to CHG or antibacterial soaps. If your skin becomes reddened/irritated stop using the CHG.  Do not shave (including legs and underarms) for at least 48 hours prior to first CHG shower. It is OK to shave your face.  Please follow these instructions carefully.    1.  Shower the NIGHT BEFORE SURGERY and the MORNING OF SURGERY with CHG Soap.   If you chose to wash your hair, wash your hair first as usual with your normal shampoo. After you shampoo, rinse your hair and body thoroughly to remove the shampoo.  Then [employer] and genitals (private parts) with your normal soap and rinse thoroughly to remove soap.  2. After that Use CHG Soap as you would any other liquid soap. You can apply CHG directly to the skin and wash gently with a scrungie or a clean washcloth.   3. Apply the CHG Soap to your body  ONLY FROM THE NECK DOWN.  Do not use on open wounds or open sores. Avoid contact with your eyes, ears, mouth and genitals (private parts). Wash Face and genitals (private parts)  with your normal soap.   4. Wash thoroughly, paying special attention to the area where your surgery will be performed.  5. Thoroughly rinse your body with warm water from the neck down.  6. DO NOT shower/wash with your normal soap after using and rinsing off the CHG Soap.  7. Pat yourself dry with a CLEAN TOWEL.  8. Wear CLEAN PAJAMAS to bed the night before surgery  9. Place CLEAN SHEETS on your bed the night before your surgery  10. DO NOT SLEEP WITH PETS.   Day of Surgery: Take a shower with CHG soap. Wear Clean/Comfortable clothing the morning of surgery Do not apply any deodorants/lotions.   Remember to brush your teeth WITH YOUR REGULAR TOOTHPASTE.   Please read over the following fact  sheets that you were given.

## 2021-04-30 ENCOUNTER — Encounter (HOSPITAL_COMMUNITY): Payer: Self-pay

## 2021-04-30 ENCOUNTER — Other Ambulatory Visit: Payer: Self-pay

## 2021-04-30 ENCOUNTER — Encounter (HOSPITAL_COMMUNITY)
Admission: RE | Admit: 2021-04-30 | Discharge: 2021-04-30 | Disposition: A | Discharge status: Home / Self Care | Payer: Medicaid Other | Source: Ambulatory Visit | Attending: Neurosurgery | Admitting: Neurosurgery

## 2021-04-30 DIAGNOSIS — Z20822 Contact with and (suspected) exposure to covid-19: Secondary | ICD-10-CM | POA: Insufficient documentation

## 2021-04-30 DIAGNOSIS — Z01812 Encounter for preprocedural laboratory examination: Secondary | ICD-10-CM | POA: Diagnosis present

## 2021-04-30 LAB — CBC
HCT: 37.2 % (ref 36.0–46.0)
Hemoglobin: 12.2 g/dL (ref 12.0–15.0)
MCH: 26.8 pg (ref 26.0–34.0)
MCHC: 32.8 g/dL (ref 30.0–36.0)
MCV: 81.8 fL (ref 80.0–100.0)
Platelets: 210 10*3/uL (ref 150–400)
RBC: 4.55 MIL/uL (ref 3.87–5.11)
RDW: 12.1 % (ref 11.5–15.5)
WBC: 3.5 10*3/uL — ABNORMAL LOW (ref 4.0–10.5)
nRBC: 0 % (ref 0.0–0.2)

## 2021-04-30 LAB — BASIC METABOLIC PANEL
Anion gap: 7 (ref 5–15)
BUN: <5 mg/dL — ABNORMAL LOW (ref 6–20)
CO2: 28 mmol/L (ref 22–32)
Calcium: 9.2 mg/dL (ref 8.9–10.3)
Chloride: 106 mmol/L (ref 98–111)
Creatinine, Ser: 0.73 mg/dL (ref 0.44–1.00)
GFR, Estimated: >60 mL/min (ref >60)
Glucose, Bld: 87 mg/dL (ref 70–99)
Potassium: 3.6 mmol/L (ref 3.5–5.1)
Sodium: 141 mmol/L (ref 135–145)

## 2021-04-30 LAB — TYPE AND SCREEN
ABO/RH(D): O POS
Antibody Screen: NEGATIVE

## 2021-04-30 LAB — SARS CORONAVIRUS 2 (TAT 6-24 HRS): SARS Coronavirus 2: NEGATIVE

## 2021-04-30 NOTE — Progress Notes (Signed)
PCP - Dr. Ralph Leyden in [place] Cardiologist - Denies  Chest x-ray - 03/28/20 EKG - 03/12/21 Stress Test - Denies ECHO - 03/31/20 Cardiac Cath - Denies  Sleep Study - Denies  DM - Denies  COVID TEST- 04/30/21  Anesthesia review: No  Patient denies shortness of breath, fever, cough and chest pain at PAT appointment   All instructions explained to the patient, with a verbal understanding of the material. Patient agrees to go over the instructions while at home for a better understanding. Patient also instructed to self quarantine after being tested for COVID-19. The opportunity to ask questions was provided.

## 2021-05-02 NOTE — Anesthesia Preprocedure Evaluation (Addendum)
Anesthesia Evaluation  Patient identified by MRN, date of birth, ID band Patient awake    Reviewed: Allergy & Precautions, NPO status , Patient's Chart, lab work & pertinent test results  Airway Mallampati: II  TM Distance: >3 FB Neck ROM: Full    Dental  (+) Teeth Intact,    Pulmonary Patient abstained from smoking., former smoker,    Pulmonary exam normal        Cardiovascular negative cardio ROS   Rhythm:Regular Rate:Normal     Neuro/Psych Pituitary tumor negative psych ROS   GI/Hepatic negative GI ROS, Neg liver ROS,   Endo/Other  negative endocrine ROS  Renal/GU negative Renal ROS  negative genitourinary   Musculoskeletal negative musculoskeletal ROS (+)   Abdominal (+)  Abdomen: soft. Bowel sounds: normal.  Peds  Hematology negative hematology ROS (+)   Anesthesia Other Findings   Reproductive/Obstetrics                            Anesthesia Physical Anesthesia Plan  ASA: III  Anesthesia Plan: General   Post-op Pain Management:    Induction: Intravenous  PONV Risk Score and Plan: 3 and Ondansetron, Dexamethasone, Midazolam and Treatment may vary due to age or medical condition  Airway Management Planned: Mask and Oral ETT  Additional Equipment: Arterial line  Intra-op Plan:   Post-operative Plan: Extubation in OR  Informed Consent: I have reviewed the patients History and Physical, chart, labs and discussed the procedure including the risks, benefits and alternatives for the proposed anesthesia with the patient or authorized representative who has indicated his/her understanding and acceptance.     Dental advisory given  Plan Discussed with: CRNA  Anesthesia Plan Comments: (Lab Results      Component                Value               Date                      WBC                      3.5 (L)             04/30/2021                HGB                      12.2                 04/30/2021                HCT                      37.2                04/30/2021                MCV                      81.8                04/30/2021                PLT                      210  04/30/2021           Lab Results      Component                Value               Date                      NA                       141                 04/30/2021                K                        3.6                 04/30/2021                CO2                      28                  04/30/2021                GLUCOSE                  87                  04/30/2021                BUN                      <5 (L)              04/30/2021                CREATININE               0.73                04/30/2021                CALCIUM                  9.2                 04/30/2021                GFRNONAA                 >60                 04/30/2021                GFRAA                    >60                 05/30/2020          )       Anesthesia Quick Evaluation

## 2021-05-03 ENCOUNTER — Inpatient Hospital Stay (HOSPITAL_COMMUNITY): Payer: Medicaid Other | Admitting: Certified Registered Nurse Anesthetist

## 2021-05-03 ENCOUNTER — Inpatient Hospital Stay (HOSPITAL_COMMUNITY)
Admission: RE | Admit: 2021-05-03 | Discharge: 2021-05-05 | DRG: 983 | Disposition: A | Discharge status: Home / Self Care | Payer: Medicaid Other | Attending: Neurosurgery | Admitting: Neurosurgery

## 2021-05-03 ENCOUNTER — Other Ambulatory Visit: Payer: Self-pay

## 2021-05-03 ENCOUNTER — Encounter (HOSPITAL_COMMUNITY)
Admission: RE | Disposition: A | Discharge status: Home / Self Care | Payer: Self-pay | Source: Home / Self Care | Attending: Neurosurgery

## 2021-05-03 ENCOUNTER — Encounter (HOSPITAL_COMMUNITY): Payer: Self-pay | Admitting: Neurosurgery

## 2021-05-03 DIAGNOSIS — D352 Benign neoplasm of pituitary gland: Principal | ICD-10-CM | POA: Diagnosis present

## 2021-05-03 DIAGNOSIS — Z880 Allergy status to penicillin: Secondary | ICD-10-CM

## 2021-05-03 DIAGNOSIS — Z87891 Personal history of nicotine dependence: Secondary | ICD-10-CM

## 2021-05-03 DIAGNOSIS — Z888 Allergy status to other drugs, medicaments and biological substances status: Secondary | ICD-10-CM

## 2021-05-03 DIAGNOSIS — Z90710 Acquired absence of both cervix and uterus: Secondary | ICD-10-CM | POA: Diagnosis not present

## 2021-05-03 DIAGNOSIS — D497 Neoplasm of unspecified behavior of endocrine glands and other parts of nervous system: Secondary | ICD-10-CM | POA: Diagnosis present

## 2021-05-03 DIAGNOSIS — Z881 Allergy status to other antibiotic agents status: Secondary | ICD-10-CM

## 2021-05-03 HISTORY — PX: CRANIOTOMY: SHX93

## 2021-05-03 LAB — CREATININE, SERUM
Creatinine, Ser: 0.67 mg/dL (ref 0.44–1.00)
GFR, Estimated: >60 mL/min (ref >60)

## 2021-05-03 LAB — CBC
HCT: 36.3 % (ref 36.0–46.0)
Hemoglobin: 12 g/dL (ref 12.0–15.0)
MCH: 26.4 pg (ref 26.0–34.0)
MCHC: 33.1 g/dL (ref 30.0–36.0)
MCV: 80 fL (ref 80.0–100.0)
Platelets: 201 10*3/uL (ref 150–400)
RBC: 4.54 MIL/uL (ref 3.87–5.11)
RDW: 12.3 % (ref 11.5–15.5)
WBC: 8 10*3/uL (ref 4.0–10.5)
nRBC: 0 % (ref 0.0–0.2)

## 2021-05-03 LAB — MRSA PCR SCREENING: MRSA by PCR: NEGATIVE

## 2021-05-03 SURGERY — CRANIOTOMY TUMOR EXCISION
Anesthesia: General | Laterality: Right

## 2021-05-03 MED ORDER — POTASSIUM CHLORIDE IN NACL 20-0.9 MEQ/L-% IV SOLN
INTRAVENOUS | Status: DC
  Filled 2021-05-03 (×4): qty 1000

## 2021-05-03 MED ORDER — PROMETHAZINE HCL 25 MG/ML IJ SOLN: 6.2500 mg | INTRAMUSCULAR | Status: DC | PRN

## 2021-05-03 MED ORDER — CHLORHEXIDINE GLUCONATE CLOTH 2 % EX PADS: 6.0000 | MEDICATED_PAD | Freq: Once | CUTANEOUS | Status: DC

## 2021-05-03 MED ORDER — LIDOCAINE 2% (20 MG/ML) 5 ML SYRINGE
INTRAMUSCULAR | Status: DC | PRN
  Administered 2021-05-03: 90 mg via INTRAVENOUS

## 2021-05-03 MED ORDER — SENNOSIDES-DOCUSATE SODIUM 8.6-50 MG PO TABS: 1.0000 | ORAL_TABLET | Freq: Every evening | ORAL | Status: DC | PRN

## 2021-05-03 MED ORDER — FENTANYL CITRATE (PF) 250 MCG/5ML IJ SOLN
INTRAMUSCULAR | Status: DC | PRN
  Administered 2021-05-03: 100 ug via INTRAVENOUS
  Administered 2021-05-03: 50 ug via INTRAVENOUS
  Administered 2021-05-03: 150 ug via INTRAVENOUS

## 2021-05-03 MED ORDER — FENTANYL CITRATE (PF) 250 MCG/5ML IJ SOLN
INTRAMUSCULAR | Status: AC
  Filled 2021-05-03: qty 10

## 2021-05-03 MED ORDER — ORAL CARE MOUTH RINSE: 15.0000 mL | Freq: Once | OROMUCOSAL | Status: AC

## 2021-05-03 MED ORDER — BISACODYL 5 MG PO TBEC: 5.0000 mg | DELAYED_RELEASE_TABLET | Freq: Every day | ORAL | Status: DC | PRN

## 2021-05-03 MED ORDER — PROPOFOL 10 MG/ML IV BOLUS
INTRAVENOUS | Status: DC | PRN
  Administered 2021-05-03 (×2): 150 mg via INTRAVENOUS

## 2021-05-03 MED ORDER — NALOXONE HCL 0.4 MG/ML IJ SOLN: 0.0800 mg | INTRAMUSCULAR | Status: DC | PRN

## 2021-05-03 MED ORDER — ROCURONIUM BROMIDE 10 MG/ML (PF) SYRINGE
PREFILLED_SYRINGE | INTRAVENOUS | Status: DC | PRN
  Administered 2021-05-03: 40 mg via INTRAVENOUS
  Administered 2021-05-03: 60 mg via INTRAVENOUS

## 2021-05-03 MED ORDER — SODIUM CHLORIDE 0.9 % IV SOLN: INTRAVENOUS | Status: DC

## 2021-05-03 MED ORDER — LACTATED RINGERS IV SOLN: INTRAVENOUS | Status: DC

## 2021-05-03 MED ORDER — SUGAMMADEX SODIUM 200 MG/2ML IV SOLN
INTRAVENOUS | Status: DC | PRN
  Administered 2021-05-03 (×2): 200 mg via INTRAVENOUS

## 2021-05-03 MED ORDER — ONDANSETRON HCL 4 MG/2ML IJ SOLN
INTRAMUSCULAR | Status: DC | PRN
  Administered 2021-05-03: 4 mg via INTRAVENOUS

## 2021-05-03 MED ORDER — DOCUSATE SODIUM 100 MG PO CAPS
100.0000 mg | ORAL_CAPSULE | Freq: Two times a day (BID) | ORAL | Status: DC
  Administered 2021-05-04: 100 mg via ORAL
  Filled 2021-05-03: qty 1

## 2021-05-03 MED ORDER — FENTANYL CITRATE (PF) 100 MCG/2ML IJ SOLN: 25.0000 ug | INTRAMUSCULAR | Status: DC | PRN

## 2021-05-03 MED ORDER — PANTOPRAZOLE SODIUM 40 MG IV SOLR
40.0000 mg | Freq: Every day | INTRAVENOUS | Status: DC
  Administered 2021-05-03 – 2021-05-04 (×2): 40 mg via INTRAVENOUS
  Filled 2021-05-03 (×2): qty 40

## 2021-05-03 MED ORDER — PROPOFOL 10 MG/ML IV BOLUS
INTRAVENOUS | Status: AC
  Filled 2021-05-03: qty 40

## 2021-05-03 MED ORDER — MORPHINE SULFATE (PF) 2 MG/ML IV SOLN: 1.0000 mg | INTRAVENOUS | Status: DC | PRN

## 2021-05-03 MED ORDER — DEXAMETHASONE 4 MG PO TABS: 4.0000 mg | ORAL_TABLET | Freq: Three times a day (TID) | ORAL | Status: DC

## 2021-05-03 MED ORDER — BACITRACIN ZINC 500 UNIT/GM EX OINT
TOPICAL_OINTMENT | CUTANEOUS | Status: DC | PRN
  Administered 2021-05-03: 1 via TOPICAL

## 2021-05-03 MED ORDER — MIDAZOLAM HCL 2 MG/2ML IJ SOLN
INTRAMUSCULAR | Status: AC
  Filled 2021-05-03: qty 2

## 2021-05-03 MED ORDER — LIDOCAINE HCL 0.5 % IJ SOLN
INTRAMUSCULAR | Status: DC | PRN
  Administered 2021-05-03: 10 mL

## 2021-05-03 MED ORDER — DEXAMETHASONE 4 MG PO TABS
4.0000 mg | ORAL_TABLET | Freq: Four times a day (QID) | ORAL | Status: AC
  Administered 2021-05-04 – 2021-05-05 (×4): 4 mg via ORAL
  Filled 2021-05-03 (×5): qty 1

## 2021-05-03 MED ORDER — EPHEDRINE SULFATE-NACL 50-0.9 MG/10ML-% IV SOSY
PREFILLED_SYRINGE | INTRAVENOUS | Status: DC | PRN
  Administered 2021-05-03: 10 mg via INTRAVENOUS

## 2021-05-03 MED ORDER — CHLORHEXIDINE GLUCONATE 0.12 % MT SOLN
15.0000 mL | Freq: Once | OROMUCOSAL | Status: AC
  Administered 2021-05-03: 15 mL via OROMUCOSAL
  Filled 2021-05-03: qty 15

## 2021-05-03 MED ORDER — DEXAMETHASONE 6 MG PO TABS
6.0000 mg | ORAL_TABLET | Freq: Four times a day (QID) | ORAL | Status: AC
  Administered 2021-05-03 – 2021-05-04 (×4): 6 mg via ORAL
  Filled 2021-05-03 (×4): qty 1

## 2021-05-03 MED ORDER — VANCOMYCIN HCL IN DEXTROSE 1-5 GM/200ML-% IV SOLN
1000.0000 mg | INTRAVENOUS | Status: AC
  Administered 2021-05-03: 1000 mg via INTRAVENOUS
  Filled 2021-05-03: qty 200

## 2021-05-03 MED ORDER — SODIUM CHLORIDE 0.9 % IV SOLN: INTRAVENOUS | Status: DC | PRN

## 2021-05-03 MED ORDER — ONDANSETRON HCL 4 MG PO TABS: 4.0000 mg | ORAL_TABLET | ORAL | Status: DC | PRN

## 2021-05-03 MED ORDER — LEVETIRACETAM IN NACL 500 MG/100ML IV SOLN
500.0000 mg | Freq: Two times a day (BID) | INTRAVENOUS | Status: DC
  Administered 2021-05-03 – 2021-05-05 (×4): 500 mg via INTRAVENOUS
  Filled 2021-05-03 (×4): qty 100

## 2021-05-03 MED ORDER — ONDANSETRON HCL 4 MG/2ML IJ SOLN: 4.0000 mg | INTRAMUSCULAR | Status: DC | PRN

## 2021-05-03 MED ORDER — THROMBIN 20000 UNITS EX SOLR
CUTANEOUS | Status: DC | PRN
  Administered 2021-05-03: 20 mL via TOPICAL

## 2021-05-03 MED ORDER — HYDROCODONE-ACETAMINOPHEN 5-325 MG PO TABS
1.0000 | ORAL_TABLET | ORAL | Status: DC | PRN
Start: 2021-05-03 — End: 2021-05-05

## 2021-05-03 MED ORDER — THROMBIN 20000 UNITS EX SOLR
CUTANEOUS | Status: AC
  Filled 2021-05-03: qty 20000

## 2021-05-03 MED ORDER — 0.9 % SODIUM CHLORIDE (POUR BTL) OPTIME
TOPICAL | Status: DC | PRN
  Administered 2021-05-03 (×2): 1000 mL

## 2021-05-03 MED ORDER — THROMBIN 20000 UNITS EX KIT
PACK | CUTANEOUS | Status: AC
  Filled 2021-05-03: qty 1

## 2021-05-03 MED ORDER — MANNITOL 25 % IV SOLN
INTRAVENOUS | Status: DC | PRN
  Administered 2021-05-03: 37.5 g via INTRAVENOUS

## 2021-05-03 MED ORDER — PROMETHAZINE HCL 12.5 MG PO TABS
12.5000 mg | ORAL_TABLET | ORAL | Status: DC | PRN
  Filled 2021-05-03: qty 2

## 2021-05-03 MED ORDER — HEPARIN SODIUM (PORCINE) 5000 UNIT/ML IJ SOLN
5000.0000 [IU] | Freq: Three times a day (TID) | INTRAMUSCULAR | Status: DC
  Administered 2021-05-03 – 2021-05-05 (×4): 5000 [IU] via SUBCUTANEOUS
  Filled 2021-05-03 (×4): qty 1

## 2021-05-03 MED ORDER — MIDAZOLAM HCL 2 MG/2ML IJ SOLN
INTRAMUSCULAR | Status: DC | PRN
  Administered 2021-05-03: 2 mg via INTRAVENOUS

## 2021-05-03 MED ORDER — LEVETIRACETAM IN NACL 1000 MG/100ML IV SOLN
1000.0000 mg | INTRAVENOUS | Status: AC
  Administered 2021-05-03: 1000 mg via INTRAVENOUS
  Filled 2021-05-03: qty 100

## 2021-05-03 MED ORDER — MICROFIBRILLAR COLL HEMOSTAT EX PADS
MEDICATED_PAD | CUTANEOUS | Status: DC | PRN
  Administered 2021-05-03: 1 via TOPICAL

## 2021-05-03 MED ORDER — APPLE CIDER VINEGAR 500 MG PO TABS: ORAL_TABLET | Freq: Every day | ORAL | Status: DC

## 2021-05-03 MED ORDER — BACITRACIN ZINC 500 UNIT/GM EX OINT
TOPICAL_OINTMENT | CUTANEOUS | Status: AC
  Filled 2021-05-03: qty 28.35

## 2021-05-03 MED ORDER — LABETALOL HCL 5 MG/ML IV SOLN: 10.0000 mg | INTRAVENOUS | Status: DC | PRN

## 2021-05-03 MED ORDER — DIPHENHYDRAMINE HCL 50 MG/ML IJ SOLN
INTRAMUSCULAR | Status: AC
  Administered 2021-05-03: 6.5 mg via INTRAVENOUS
  Filled 2021-05-03: qty 1

## 2021-05-03 MED ORDER — PHENYLEPHRINE HCL-NACL 10-0.9 MG/250ML-% IV SOLN
INTRAVENOUS | Status: DC | PRN
  Administered 2021-05-03: 10 ug/min via INTRAVENOUS

## 2021-05-03 MED ORDER — LIDOCAINE HCL (PF) 0.5 % IJ SOLN
INTRAMUSCULAR | Status: AC
  Filled 2021-05-03: qty 50

## 2021-05-03 MED ORDER — DIPHENHYDRAMINE HCL 50 MG/ML IJ SOLN: 6.2500 mg | Freq: Once | INTRAMUSCULAR | Status: AC

## 2021-05-03 MED ORDER — CHLORHEXIDINE GLUCONATE CLOTH 2 % EX PADS
6.0000 | MEDICATED_PAD | Freq: Every day | CUTANEOUS | Status: DC
  Administered 2021-05-04 – 2021-05-05 (×2): 6 via TOPICAL

## 2021-05-03 MED ORDER — HYDROCORTISONE NA SUCCINATE PF 250 MG IJ SOLR
INTRAMUSCULAR | Status: AC
  Filled 2021-05-03: qty 250

## 2021-05-03 MED ORDER — ACETAMINOPHEN 10 MG/ML IV SOLN: 1000.0000 mg | Freq: Once | INTRAVENOUS | Status: DC | PRN

## 2021-05-03 MED ORDER — HYDROCORTISONE NA SUCCINATE PF 100 MG IJ SOLR
INTRAMUSCULAR | Status: DC | PRN
  Administered 2021-05-03: 100 mg via INTRAVENOUS

## 2021-05-03 MED ORDER — WHITE PETROLATUM EX OINT
TOPICAL_OINTMENT | CUTANEOUS | Status: AC
  Administered 2021-05-03: 1
  Filled 2021-05-03: qty 28.35

## 2021-05-03 SURGICAL SUPPLY — 80 items
BAND RUBBER #18 3X1/16 STRL (MISCELLANEOUS) IMPLANT
BENZOIN TINCTURE PRP APPL 2/3 (GAUZE/BANDAGES/DRESSINGS) IMPLANT
BLADE CLIPPER SURG (BLADE) ×2 IMPLANT
BLADE EYE SICKLE 84 5 BEAV (BLADE) ×2 IMPLANT
BLADE SAW GIGLI 16 STRL (MISCELLANEOUS) IMPLANT
BLADE SURG 15 STRL LF DISP TIS (BLADE) IMPLANT
BLADE SURG 15 STRL SS (BLADE)
BLADE ULTRA TIP 2M (BLADE) IMPLANT
BNDG GAUZE ELAST 4 BULKY (GAUZE/BANDAGES/DRESSINGS) ×2 IMPLANT
BNDG STRETCH 4X75 NS LF (GAUZE/BANDAGES/DRESSINGS) ×2 IMPLANT
BUR ACORN 6.0 PRECISION (BURR) ×2 IMPLANT
BUR MATCHSTICK NEURO 3.0 LAGG (BURR) ×2 IMPLANT
BUR SPIRAL ROUTER 2.3 (BUR) ×2 IMPLANT
CANISTER SUCT 3000ML PPV (MISCELLANEOUS) ×4 IMPLANT
CARTRIDGE OIL MAESTRO DRILL (MISCELLANEOUS) ×1 IMPLANT
CATH VENTRIC 35X38 W/TROCAR LG (CATHETERS) IMPLANT
CLIP VESOCCLUDE MED 6/CT (CLIP) IMPLANT
CNTNR URN SCR LID CUP LEK RST (MISCELLANEOUS) ×1 IMPLANT
CONT SPEC 4OZ STRL OR WHT (MISCELLANEOUS) ×1
COVER WAND RF STERILE (DRAPES) ×2 IMPLANT
DECANTER SPIKE VIAL GLASS SM (MISCELLANEOUS) ×2 IMPLANT
DIFFUSER DRILL AIR PNEUMATIC (MISCELLANEOUS) ×2 IMPLANT
DRAIN SUBARACHNOID (WOUND CARE) IMPLANT
DRAPE CAMERA VIDEO/LASER (DRAPES) IMPLANT
DRAPE MICROSCOPE LEICA (MISCELLANEOUS) ×2 IMPLANT
DRAPE NEUROLOGICAL W/INCISE (DRAPES) ×2 IMPLANT
DRAPE ORTHO SPLIT 77X108 STRL (DRAPES)
DRAPE SURG 17X23 STRL (DRAPES) IMPLANT
DRAPE SURG ORHT 6 SPLT 77X108 (DRAPES) IMPLANT
DRAPE WARM FLUID 44X44 (DRAPES) ×2 IMPLANT
DURAPREP 6ML APPLICATOR 50/CS (WOUND CARE) ×2 IMPLANT
ELECT REM PT RETURN 9FT ADLT (ELECTROSURGICAL) ×2
ELECTRODE REM PT RTRN 9FT ADLT (ELECTROSURGICAL) ×1 IMPLANT
EVACUATOR 1/8 PVC DRAIN (DRAIN) IMPLANT
EVACUATOR SILICONE 100CC (DRAIN) IMPLANT
FORCEPS BIPOLAR SPETZLER 8 1.0 (NEUROSURGERY SUPPLIES) ×2 IMPLANT
GAUZE 4X4 16PLY RFD (DISPOSABLE) IMPLANT
GAUZE SPONGE 4X4 12PLY STRL (GAUZE/BANDAGES/DRESSINGS) ×2 IMPLANT
GLOVE ECLIPSE 6.5 STRL STRAW (GLOVE) ×2 IMPLANT
GLOVE EXAM NITRILE XL STR (GLOVE) IMPLANT
GOWN STRL REUS W/ TWL LRG LVL3 (GOWN DISPOSABLE) ×4 IMPLANT
GOWN STRL REUS W/ TWL XL LVL3 (GOWN DISPOSABLE) IMPLANT
GOWN STRL REUS W/TWL 2XL LVL3 (GOWN DISPOSABLE) ×4 IMPLANT
GOWN STRL REUS W/TWL LRG LVL3 (GOWN DISPOSABLE) ×4
GOWN STRL REUS W/TWL XL LVL3 (GOWN DISPOSABLE)
HEMOSTAT SURGICEL 2X14 (HEMOSTASIS) IMPLANT
HOOK DURA 1/2IN (MISCELLANEOUS) ×4 IMPLANT
KIT BASIN OR (CUSTOM PROCEDURE TRAY) ×2 IMPLANT
KIT DRAIN CSF ACCUDRAIN (MISCELLANEOUS) IMPLANT
KIT TURNOVER KIT B (KITS) ×2 IMPLANT
KNIFE ARACHNOID DISP AM-24-XSB (BLADE) ×2 IMPLANT
NEEDLE HYPO 25X1 1.5 SAFETY (NEEDLE) ×2 IMPLANT
NEEDLE SPNL 18GX3.5 QUINCKE PK (NEEDLE) IMPLANT
NS IRRIG 1000ML POUR BTL (IV SOLUTION) ×6 IMPLANT
OIL CARTRIDGE MAESTRO DRILL (MISCELLANEOUS) ×2
PACK CRANIOTOMY CUSTOM (CUSTOM PROCEDURE TRAY) ×2 IMPLANT
PATTIES SURGICAL .25X.25 (GAUZE/BANDAGES/DRESSINGS) IMPLANT
PATTIES SURGICAL .5 X.5 (GAUZE/BANDAGES/DRESSINGS) IMPLANT
PATTIES SURGICAL .5 X3 (DISPOSABLE) ×2 IMPLANT
PATTIES SURGICAL 1/4 X 3 (GAUZE/BANDAGES/DRESSINGS) IMPLANT
PATTIES SURGICAL 1X1 (DISPOSABLE) IMPLANT
PLATE BONE 12 2H TARGET XL (Plate) ×6 IMPLANT
SCREW UNIII AXS SD 1.5X4 (Screw) ×8 IMPLANT
SPECIMEN JAR SMALL (MISCELLANEOUS) IMPLANT
SPONGE NEURO XRAY DETECT 1X3 (DISPOSABLE) IMPLANT
SPONGE SURGIFOAM ABS GEL 100 (HEMOSTASIS) ×2 IMPLANT
STAPLER VISISTAT 35W (STAPLE) ×2 IMPLANT
SUT ETHILON 3 0 FSL (SUTURE) ×2 IMPLANT
SUT ETHILON 3 0 PS 1 (SUTURE) IMPLANT
SUT NURALON 4 0 TR CR/8 (SUTURE) ×6 IMPLANT
SUT SILK 0 TIES 10X30 (SUTURE) IMPLANT
SUT STEEL 0 (SUTURE)
SUT STEEL 0 18XMFL TIE 17 (SUTURE) IMPLANT
SUT VIC AB 2-0 CT2 18 VCP726D (SUTURE) ×4 IMPLANT
TOWEL GREEN STERILE (TOWEL DISPOSABLE) ×2 IMPLANT
TOWEL GREEN STERILE FF (TOWEL DISPOSABLE) ×2 IMPLANT
TRAY FOLEY MTR SLVR 16FR STAT (SET/KITS/TRAYS/PACK) ×2 IMPLANT
TUBE CONNECTING 12X1/4 (SUCTIONS) ×2 IMPLANT
UNDERPAD 30X36 HEAVY ABSORB (UNDERPADS AND DIAPERS) IMPLANT
WATER STERILE IRR 1000ML POUR (IV SOLUTION) ×2 IMPLANT

## 2021-05-03 NOTE — Anesthesia Procedure Notes (Signed)
Arterial Line Insertion Performed by: CRNA  Patient location: Pre-op. Preanesthetic checklist: patient identified, IV checked, site marked, risks and benefits discussed, surgical consent, monitors and equipment checked, pre-op evaluation, timeout performed and anesthesia consent Lidocaine 1% used for infiltration Left, radial was placed Catheter size: 20 G Hand hygiene performed , maximum sterile barriers used  and Seldinger technique used Allen's test indicative of satisfactory collateral circulation Attempts: 1 Procedure performed without using ultrasound guided technique. Following insertion, dressing applied and Biopatch. Post procedure assessment: normal  Patient tolerated the procedure well with no immediate complications.

## 2021-05-03 NOTE — Anesthesia Procedure Notes (Signed)
Procedure Name: Intubation Date/Time: 05/03/2021 9:10 AM Performed by: Valda Favia, CRNA Pre-anesthesia Checklist: Patient identified, Emergency Drugs available, Suction available and Patient being monitored Patient Re-evaluated:Patient Re-evaluated prior to induction Oxygen Delivery Method: Circle System Utilized Preoxygenation: Pre-oxygenation with 100% oxygen Induction Type: IV induction Ventilation: Mask ventilation without difficulty and Oral airway inserted - appropriate to patient size Laryngoscope Size: Mac and 4 Grade View: Grade I Tube type: Oral Tube size: 7.0 mm Number of attempts: 1 Airway Equipment and Method: Stylet and Oral airway Placement Confirmation: ETT inserted through vocal cords under direct vision,  positive ETCO2 and breath sounds checked- equal and bilateral Secured at: 23 cm Tube secured with: Tape Dental Injury: Teeth and Oropharynx as per pre-operative assessment

## 2021-05-03 NOTE — Anesthesia Postprocedure Evaluation (Signed)
Anesthesia Post Note  Patient: Kristen Williamson  Procedure(s) Performed: Right Pterional craniotomy for tumor (Right )     Patient location during evaluation: PACU Anesthesia Type: General Level of consciousness: awake and alert Pain management: pain level controlled Vital Signs Assessment: post-procedure vital signs reviewed and stable Respiratory status: spontaneous breathing, nonlabored ventilation, respiratory function stable and patient connected to nasal cannula oxygen Cardiovascular status: blood pressure returned to baseline and stable Postop Assessment: no apparent nausea or vomiting Anesthetic complications: no   No complications documented.  Last Vitals:  Vitals:   05/03/21 1510 05/03/21 1525  BP: 98/62   Pulse: (!) 53   Resp: 11   Temp:  (!) 36.1 C  SpO2: 100%     Last Pain:  Vitals:   05/03/21 1445  TempSrc:   PainSc: Asleep                 Belenda Cruise P Dyasia Firestine

## 2021-05-03 NOTE — Progress Notes (Signed)
PHARMACIST - PHYSICIAN ORDER COMMUNICATION  CONCERNING: P&T Medication Policy on Herbal Medications  DESCRIPTION:  This patient's order for:  Apple Cider Vinegar tabs  has been noted.  This product(s) is classified as an "herbal" or natural product. Due to a lack of definitive safety studies or FDA approval, nonstandard manufacturing practices, plus the potential risk of unknown drug-drug interactions while on inpatient medications, the Pharmacy and Therapeutics Committee does not permit the use of "herbal" or natural products of this type within [facility].   ACTION TAKEN: The pharmacy department is unable to verify this order at this time and your patient has been informed of this safety policy. Please reevaluate patient's clinical condition at discharge and address if the herbal or natural product(s) should be resumed at that time.  Kristen Williamson, PharmD, BCPS Clinical Staff Pharmacist [email or web address]

## 2021-05-03 NOTE — Transfer of Care (Addendum)
Immediate Anesthesia Transfer of Care Note  Patient: Kristen Williamson  Procedure(s) Performed: Right Pterional craniotomy for tumor (Right )  Patient Location: PACU  Anesthesia Type:General  Level of Consciousness: sedated and Patient remains intubated per anesthesia plan  Airway & Oxygen Therapy: Patient remains intubated per anesthesia plan and Patient placed on Ventilator (see vital sign flow sheet for setting)  Post-op Assessment: Report given to RN and Post -op Vital signs reviewed and stable  Post vital signs: Reviewed and stable  Last Vitals:  Vitals Value Taken Time  BP 115/68 05/03/21 1340  Temp    Pulse 65 05/03/21 1347  Resp 16 05/03/21 1347  SpO2 100 % 05/03/21 1347  Vitals shown include unvalidated device data.  Last Pain:  Vitals:   05/03/21 0727  TempSrc:   PainSc: 0-No pain      Patients Stated Pain Goal: 4 (82/41/75 3010)  Complications: No complications documented.

## 2021-05-03 NOTE — Progress Notes (Addendum)
Patient developed a reaction to the Vancomycin given pre-operatively.  Patient called out complaining of severe itching.  Once RN got to bedside patient had scratched so bad that she has drew blood on the upper back/shoulder area bilaterally.  Patient notified of the injury.  Anesthesiology contacted for order for benadryl, vancomycin stopped.  Patient had received all of the vancomycin before itching started.    When patient was scratching she scratched so bad that she broke the skin on her upper back/shoulders.  Skin has abrasions and is open on the left upper back.  Dr Cyndy Freeze made aware of the reaction and it has been noted on her allergy list.    Plan to still proceed with procedure today

## 2021-05-03 NOTE — H&P (Signed)
BP 127/90   Pulse 76   Temp 98.4 F (36.9 C) (Oral)   Resp 18   Ht 5\' 11"  (1.803 m)   Wt 74.4 kg   SpO2 98%   BMI 22.88 kg/m     Kristen Williamson returns today with a new MRI of the pituitary.  The mass is grown again.  There is still some solid structure, but then most of it is cystic.  Nonetheless, the capsule is present and we are going to just have to execute the plan previously.  I will pterional craniotomy and remove the tumor from above. This way I can ensure that the cyst wall has been resected and therefore cannot recur.  She weighs 163 pounds.  Temperature is 97.1, blood pressure is 145/84, pulse is 76.  Pain is 7/10. Allergies  Allergen Reactions  . Penicillins Other (See Comments)    Reaction not recalled by the patient  . Vancomycin Itching    Patient developed sever itching after the vancomycin ran completely in.  Patient treated with benadryl and itching got better.  Kristen Williamson [Hydrocodone-Acetaminophen] Nausea And Vomiting   Past Surgical History:  Procedure Laterality Date  . ABDOMINAL HYSTERECTOMY    . birth mark removed    . CRANIOTOMY N/A 05/30/2020   Procedure: Transphenoidal Resection of Tumor;  Surgeon: Ashok Pall, MD;  Location: [facility];  Service: Neurosurgery;  Laterality: N/A;  Transphenoidal Resection of Tumor  . TRANSPHENOIDAL APPROACH EXPOSURE N/A 05/30/2020   Procedure: TRANSPHENOIDAL APPROACH EXPOSURE;  Surgeon: Izora Gala, MD;  Location: [place];  Service: ENT;  Laterality: N/A;  TRANSPHENOIDAL APPROACH EXPOSURE   Past Medical History:  Diagnosis Date  . Pituitary tumor    History reviewed. No pertinent family history. Social History   Socioeconomic History  . Marital status: Single    Spouse name: Not on file  . Number of children: Not on file  . Years of education: Not on file  . Highest education level: Not on file  Occupational History  . Not on file  Tobacco Use  . Smoking status: Former Smoker    Quit date: 2021    Years since quitting:  1.4  . Smokeless tobacco: Never Used  Vaping Use  . Vaping Use: Never used  Substance and Sexual Activity  . Alcohol use: Yes    Comment: occasional  . Drug use: No  . Sexual activity: Not Currently    Birth control/protection: Surgical  Other Topics Concern  . Not on file  Social History Narrative  . Not on file   Social Determinants of Health   Financial Resource Strain: Not on file  Food Insecurity: Not on file  Transportation Needs: Not on file  Physical Activity: Not on file  Stress: Not on file  Social Connections: Not on file  Intimate Partner Violence: Not on file   Physical Exam Constitutional:      General: She is not in acute distress.    Appearance: Normal appearance. She is normal weight.  HENT:     Head: Normocephalic and atraumatic.     Nose: Nose normal.     Mouth/Throat:     Mouth: Mucous membranes are moist.     Pharynx: Oropharynx is clear.  Eyes:     Extraocular Movements: Extraocular movements intact.     Pupils: Pupils are equal, round, and reactive to light.  Cardiovascular:     Rate and Rhythm: Normal rate and regular rhythm.     Pulses: Normal pulses.  Heart sounds: Normal heart sounds.  Pulmonary:     Effort: Pulmonary effort is normal.     Breath sounds: Normal breath sounds.  Abdominal:     General: Abdomen is flat.     Palpations: Abdomen is soft.  Musculoskeletal:        General: Normal range of motion.     Cervical back: Normal range of motion and neck supple.  Skin:    General: Skin is warm and dry.  Neurological:     General: No focal deficit present.     Mental Status: She is alert and oriented to person, place, and time.     Cranial Nerves: Cranial nerves are intact.     Sensory: Sensation is intact.     Motor: Motor function is intact.     Coordination: Coordination is intact.     Deep Tendon Reflexes: Reflexes are normal and symmetric.        She has a headache, which bothers her a great deal.  I do not think it  is due to this, but one way or the other, this still needs to be resected.  We went over the risks of bleeding, infection, tumor, stroke, change in personality, brain damage, and recurrent tumor or inability to resect the tumor.  She understands and wishes to proceed.

## 2021-05-04 ENCOUNTER — Inpatient Hospital Stay (HOSPITAL_COMMUNITY): Payer: Medicaid Other

## 2021-05-04 MED ORDER — GADOBUTROL 1 MMOL/ML IV SOLN
7.5000 mL | Freq: Once | INTRAVENOUS | Status: AC | PRN
  Administered 2021-05-04: 7.5 mL via INTRAVENOUS

## 2021-05-04 NOTE — Progress Notes (Signed)
Subjective: No acute events overnight  Objective: Vital signs in last 24 hours: Temp:  [97 F (36.1 C)-100.3 F (37.9 C)] 99.9 F (37.7 C) (05/28 0800) Pulse Rate:  [51-85] 74 (05/28 0700) Resp:  [11-21] 19 (05/28 0700) BP: (98-135)/(62-81) 112/69 (05/28 0700) SpO2:  [97 %-100 %] 98 % (05/28 0700) Arterial Line BP: (116-149)/(49-74) 134/60 (05/28 0700) FiO2 (%):  [40 %] 40 % (05/27 1339) Weight:  [71.4 kg] 71.4 kg (05/27 1554)  Intake/Output from previous day: 05/27 0701 - 05/28 0700 In: 3434.6 [P.O.:80; I.V.:3004.8; IV Piggyback:349.8] Out: 2035 [Urine:1735; Blood:300] Intake/Output this shift: No intake/output data recorded.  Physical Exam: Patient is awake, A/O X 4, and following commands, speech is fluent and appropriate. PERRLA. Doing well. MAEW   Lab Results: Recent Labs    05/03/21 1556  WBC 8.0  HGB 12.0  HCT 36.3  PLT 201   BMET Recent Labs    05/03/21 1556  CREATININE 0.67    Studies/Results: No results found.  Assessment/Plan: 45 y.o. female who is post op day 1 s/p right craniotomy. Patient is doing well. MRI pending. Continue supportive care. No new neurosurgical recommendations.   LOS: 1 day     Marvis Moeller, DNP, NP-C 05/04/2021, 9:06 AM

## 2021-05-04 NOTE — Progress Notes (Signed)
Patient ID: Kristen Williamson, female   DOB: [birth year], 45 y.o.   MRN: [id number] BP 109/64   Pulse 77   Temp 98.8 F (37.1 C) (Axillary)   Resp (!) 23   Ht 5\' 10"  (1.778 m)   Wt 71.4 kg   SpO2 97%   BMI 22.59 kg/m  MRI reviewed, expected post op changes. There is no impingement on the right or left optic nerves by the tumor. I hollowed out a good amount of tumor intraoperatively. Looks goog

## 2021-05-04 NOTE — Op Note (Signed)
05/03/2021  3:37 PM  PATIENT:  Kristen Williamson  45 y.o. female  PRE-OPERATIVE DIAGNOSIS:  Invasive pituitary adenoma  POST-OPERATIVE DIAGNOSIS:  invasive pituitary adenoma  PROCEDURE:  Procedure(s): Right Pterional craniotomy for tumor Microscopic dissection SURGEON: Surgeon(s): Ashok Pall, MD Erline Levine, MD  ASSISTANTS:Stern, Broadus John  ANESTHESIA:   local and general  EBL:  Total I/O In: 738.1 [P.O.:120; I.V.:518.1; IV Piggyback:100] Out: 498 [Urine:875]  BLOOD ADMINISTERED:none  CELL SAVER GIVEN:none  COUNT: per nursing  DRAINS: none   SPECIMEN:  Source of Specimen:  suprasellar tumor  DICTATION: [name] was taken to the operating room, intubated, and placed under a general anesthetic without difficulty. She was positioned supine after a [place] three pin head holder was applied under adequate anesthesia. Her head was shaved,  was prepped and was draped in a sterile manner. I made an incision for a pterional craniotomy on the right side. I placed Raney clips along the scalp edges. I developed a flap reflecting it rostrally. I divided the temporalis muscle and reflected it both rostrally and caudally. The pterion was exposed. I drilled a burr hole in the pterion, and fashioned a craniotomy exposing the frontal and middle fossa, frontal and temporal lobes. I first retracted the frontal lobe with suction and dissected over the optic nerve. I released spinal fluid and decompressed the brain. I dissected more and exposed the internal carotid artery, and the middle cerebral and anterior cerebral arteries. The tumor was apparent between the optic nerve and the ICA. I coagulated the capsule of the tumor, then opened it with a blade. I entered the interior with suction and ruptured the cystic portion allowing the tumor to decompress. I used the bipolar to cauterize the interior and exterior portion of the capsule and shrink it more.  I used various curettes from  the transsphenoidal set to remove more of the tumor. I did not believe that I would be able to remove all of the capsule safely and concurred with my colleague Dr. Vertell Limber that future radiation would be as effective and less traumatic than going for a gross total resection. I irrigated, then closed.  We approximated the dural opening with suture. The bone flap was approximated with plates and screws. The temporalis fascia was approximated with sutures. The galea was closed and the scalp edges with staples. Specimen from the tumor was sent to the lab.   PLAN OF CARE: Admit to inpatient   PATIENT DISPOSITION:  PACU - hemodynamically stable.   Delay start of Pharmacological VTE agent (>24hrs) due to surgical blood loss or risk of bleeding:  no

## 2021-05-05 LAB — RENAL FUNCTION PANEL
Albumin: 3 g/dL — ABNORMAL LOW (ref 3.5–5.0)
Anion gap: 7 (ref 5–15)
BUN: 14 mg/dL (ref 6–20)
CO2: 22 mmol/L (ref 22–32)
Calcium: 8.3 mg/dL — ABNORMAL LOW (ref 8.9–10.3)
Chloride: 112 mmol/L — ABNORMAL HIGH (ref 98–111)
Creatinine, Ser: 0.86 mg/dL (ref 0.44–1.00)
GFR, Estimated: >60 mL/min (ref >60)
Glucose, Bld: 221 mg/dL — ABNORMAL HIGH (ref 70–99)
Phosphorus: 2.8 mg/dL (ref 2.5–4.6)
Potassium: 3.8 mmol/L (ref 3.5–5.1)
Sodium: 141 mmol/L (ref 135–145)

## 2021-05-05 MED ORDER — HYDROCODONE-ACETAMINOPHEN 5-325 MG PO TABS: 1.0000 | ORAL_TABLET | ORAL | 0 refills | Status: DC | PRN

## 2021-05-05 MED ORDER — PANTOPRAZOLE SODIUM 40 MG PO TBEC: 40.0000 mg | DELAYED_RELEASE_TABLET | Freq: Every day | ORAL | Status: DC

## 2021-05-05 NOTE — Progress Notes (Signed)
Neurosurgery Service Progress Note  Subjective: No acute events overnight, pain well controlled, R eye swollen mostly shut, pt reports vision is  Doing well  Objective: Vitals:   05/05/21 0600 05/05/21 0700 05/05/21 0800 05/05/21 0900  BP: 112/75 112/68 114/63 115/71  Pulse: 87 79 77 75  Resp: (!) 21 17 (!) 26 16  Temp:   98.8 F (37.1 C)   TempSrc:   Oral   SpO2: 95% 98% 97% 96%  Weight:      Height:        Physical Exam: R eye swollen shut, awake/alert Ox3, left pupil RRL, FS & SS, strength 5/5x4  Assessment & Plan: 45 y.o. woman s/p R pterional for pituitary adenoma, recovering well.  -pt ambulatory, tolerating regular diet, no polyuria/polydypsia, can d/c steroids and keppra, will get RFP this morning to evaluate for sodium, if WNL then discharge home later today  Judith Part  05/05/21 9:43 AM

## 2021-05-05 NOTE — Discharge Summary (Signed)
Discharge Summary  Date of Admission: 05/03/2021  Date of Discharge: 05/05/21  Attending Physician: Ashok Pall, MD  Hospital Course: Patient was admitted following an uncomplicated craniotomy for resection of a pituitary adenoma. She was recovered in PACU and transferred to 4N ICU. A post-op MRI showed good debulking of the tumor, her hospital course was uncomplicated and the patient was discharged home on 05/05/21. She will follow up in clinic with Dr. Christella Noa in 2 weeks.  Neurologic exam at discharge:  AOx3, R eye swollen shut, left eye with good visual acuity, FS & SS Strength 5/5 x4, SILTx4, no drift  Discharge diagnosis: Pituitary adenoma  Judith Part, MD 05/05/21 12:45 PM

## 2021-05-05 NOTE — Progress Notes (Signed)
AVS discharge paperwork gone over with patient and all questions answered. 2 peripheral IV's removed and VSS. Patient's daughter is to pick her up.

## 2021-05-05 NOTE — Discharge Instructions (Signed)
Discharge Instructions  No restriction in activities, slowly increase your activity back to normal.   Okay to shower on the day of discharge. Use regular soap and water and try to be gentle when cleaning your incision. No need for a dressing on your incision. In the first few days after surgery, there may be some bloody drainage from your wound. If this happens, you can cover your incision with a gauze dressing to prevent it from staining your clothes or bed linens. If your incision begins to itch, rub some bacitracin or neosporin ointment on it instead of scratching it.  Follow up with Dr. Christella Noa in 2 weeks after discharge. If you do not already have a discharge appointment, please call his office at [phone] to schedule a follow up appointment. If you have any concerns or questions, please call the office and let us know.

## 2021-05-07 ENCOUNTER — Encounter (HOSPITAL_COMMUNITY): Payer: Self-pay | Admitting: Neurosurgery

## 2021-05-07 LAB — SURGICAL PATHOLOGY

## 2021-05-15 ENCOUNTER — Encounter (HOSPITAL_COMMUNITY): Payer: Self-pay | Admitting: Neurosurgery

## 2021-05-15 NOTE — Addendum Note (Signed)
Addendum  created 05/15/21 1254 by Darral Dash, DO   Clinical Note Signed, Intraprocedure Event edited, Intraprocedure Staff edited

## 2021-06-09 IMAGING — DX DG CHEST 1V PORT
1 series · 1 of 1 positions shown · non-contrast
Comparison: None.

CLINICAL DATA: Pt reports generalized weakness and fatigue for the
past week, not eating or drinking much due to nausea. Pt has brain
tumor and is currently receiving radiation treatment. Pt ill
appearing in triage

EXAM:
PORTABLE CHEST 1 VIEW

[chest ap]
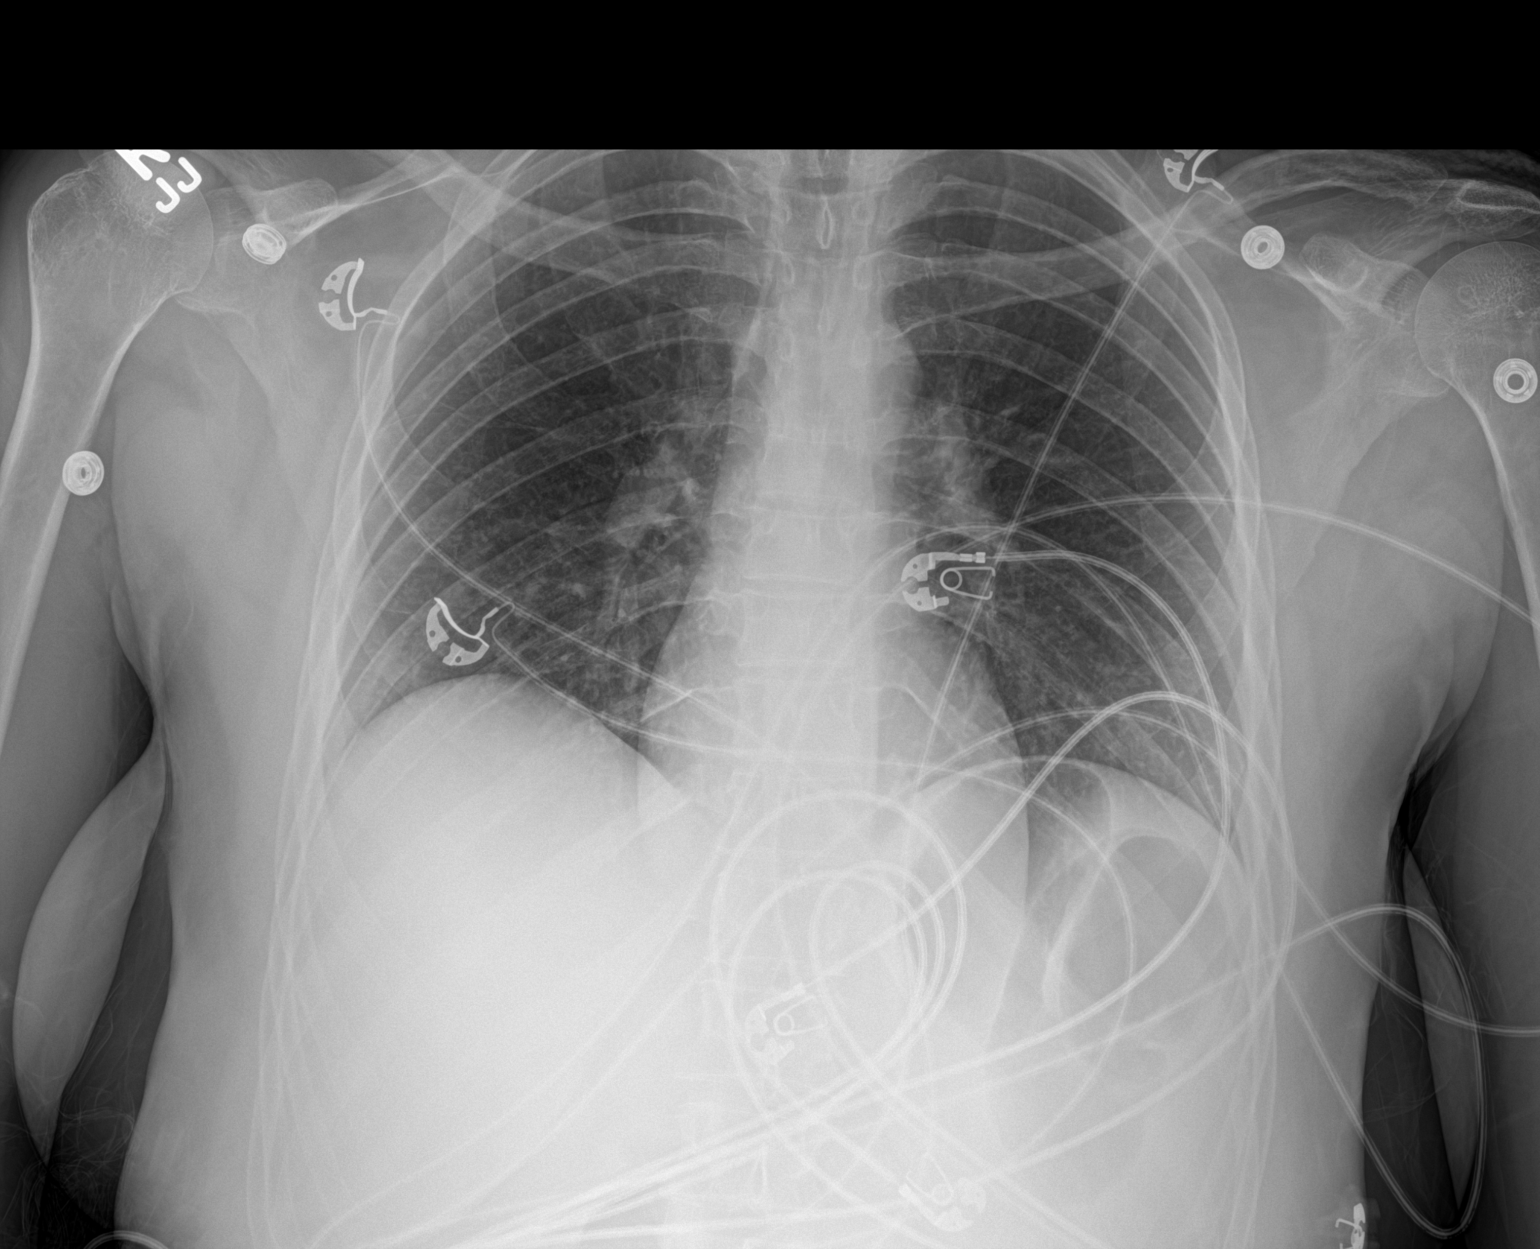

[1 of 1 positions shown; findings below may reference images not displayed]

FINDINGS: Normal heart, mediastinum and hila.

Lungs are clear.  No pleural effusion or pneumothorax.

Skeletal structures are grossly intact.
IMPRESSION: No active disease.

## 2021-11-04 ENCOUNTER — Ambulatory Visit: Payer: Self-pay

## 2021-11-04 NOTE — Telephone Encounter (Signed)
Pt. Reports she started vomiting yesterday. Daughter states they talked to oncology and they were instructed to go to ED. Pt. States she will go.    Reason for Disposition  High-risk adult (e.g., diabetes mellitus, brain tumor, V-P shunt, hernia)  Answer Assessment - Initial Assessment Questions 1. VOMITING SEVERITY: "How many times have you vomited in the past 24 hours?"     - MILD:  1 - 2 times/day    - MODERATE: 3 - 5 times/day, decreased oral intake without significant weight loss or symptoms of dehydration    - SEVERE: 6 or more times/day, vomits everything or nearly everything, with significant weight loss, symptoms of dehydration      1 2. ONSET: "When did the vomiting begin?"      Last night 3. FLUIDS: "What fluids or food have you vomited up today?" "Have you been able to keep any fluids down?"     Keeping fluids down 4. ABDOMINAL PAIN: "Are your having any abdominal pain?" If yes : "How bad is it and what does it feel like?" (e.g., crampy, dull, intermittent, constant)      No 5. DIARRHEA: "Is there any diarrhea?" If Yes, ask: "How many times today?"      Yes - yesterday 6. CONTACTS: "Is there anyone else in the family with the same symptoms?"      No 7. CAUSE: "What do you think is causing your vomiting?"     Unsure 8. HYDRATION STATUS: "Any signs of dehydration?" (e.g., dry mouth [not only dry lips], too weak to stand) "When did you last urinate?"     No 9. OTHER SYMPTOMS: "Do you have any other symptoms?" (e.g., fever, headache, vertigo, vomiting blood or coffee grounds, recent head injury)     No 10. PREGNANCY: "Is there any chance you are pregnant?" "When was your last menstrual period?"       No  Protocols used: Vomiting-A-AH

## 2022-03-05 ENCOUNTER — Emergency Department (HOSPITAL_COMMUNITY): Payer: Medicaid Other

## 2022-03-05 ENCOUNTER — Emergency Department (HOSPITAL_COMMUNITY)
Admission: EM | Admit: 2022-03-05 | Discharge: 2022-03-05 | Disposition: A | Discharge status: Home / Self Care | Payer: Medicaid Other | Attending: Emergency Medicine | Admitting: Emergency Medicine

## 2022-03-05 ENCOUNTER — Encounter (HOSPITAL_COMMUNITY): Payer: Self-pay | Admitting: Emergency Medicine

## 2022-03-05 DIAGNOSIS — M25562 Pain in left knee: Secondary | ICD-10-CM | POA: Diagnosis not present

## 2022-03-05 DIAGNOSIS — X501XXA Overexertion from prolonged static or awkward postures, initial encounter: Secondary | ICD-10-CM | POA: Insufficient documentation

## 2022-03-05 DIAGNOSIS — M25561 Pain in right knee: Secondary | ICD-10-CM | POA: Insufficient documentation

## 2022-03-05 DIAGNOSIS — Y92811 Bus as the place of occurrence of the external cause: Secondary | ICD-10-CM | POA: Insufficient documentation

## 2022-03-05 MED ORDER — ACETAMINOPHEN 325 MG PO TABS
650.0000 mg | ORAL_TABLET | Freq: Once | ORAL | Status: AC
  Administered 2022-03-05: 650 mg via ORAL
  Filled 2022-03-05: qty 2

## 2022-03-05 MED ORDER — ACETAMINOPHEN 325 MG PO TABS
ORAL_TABLET | ORAL | Status: AC
  Filled 2022-03-05: qty 1

## 2022-03-05 NOTE — ED Provider Notes (Signed)
?[facility] [facility] DEPT ?Provider Note ? ? ?CSN: [id number] ?Arrival date & time: 03/05/22  5885 ? ?  ? ?History ? ?Chief Complaint  ?Patient presents with  ? Knee Pain  ? ? ?Kristen Williamson is a 46 y.o. female. ? ?Patient with no pertinent past medical history presents today with complaints of bilateral knee pain. States that same began when she stepped off a school bus this morning. She states that when she stepped down the step both of her knees buckled in and she had acute onset pain in both knees. She did not fall or hit her head, states that she has been able to walk since with some discomfort. Pain is worse on the left than the right.  ? ?The history is provided by the patient. No language interpreter was used.  ?Knee Pain ?Associated symptoms: no fever   ? ?  ? ?Home Medications ?Prior to Admission medications   ?Medication Sig Start Date End Date Taking? Authorizing Provider  ?APPLE CIDER VINEGAR PO Take 2 tablets by mouth daily.    [provider]  ?HYDROcodone-acetaminophen (NORCO/VICODIN) 5-325 MG tablet Take 1 tablet by mouth every 4 (four) hours as needed for moderate pain. 05/05/21   Judith Part, MD  ?cetirizine (ZYRTEC ALLERGY) 10 MG tablet Take 1 tablet (10 mg total) by mouth daily. 07/08/20 03/12/21  Jaynee Eagles, PA-C  ?   ? ?Allergies    ?Penicillins, Vancomycin, and Lortab [hydrocodone-acetaminophen]   ? ?Review of Systems   ?Review of Systems  ?Constitutional:  Negative for chills and fever.  ?Musculoskeletal:  Positive for arthralgias and myalgias. Negative for gait problem.  ?All other systems reviewed and are negative. ? ?Physical Exam ?Updated Vital Signs ?BP 139/87 (BP Location: Left Arm)   Pulse 75   Temp 98.7 ?F (37.1 ?C) (Oral)   Resp 16   SpO2 99%  ?Physical Exam ?Vitals and nursing note reviewed.  ?Constitutional:   ?   General: She is not in acute distress. ?   Appearance: Normal appearance. She is normal weight. She is not ill-appearing,  toxic-appearing or diaphoretic.  ?HENT:  ?   Head: Normocephalic and atraumatic.  ?Cardiovascular:  ?   Rate and Rhythm: Normal rate.  ?Pulmonary:  ?   Effort: Pulmonary effort is normal. No respiratory distress.  ?Musculoskeletal:     ?   General: Tenderness present.  ?   Cervical back: Normal range of motion.  ?   Comments: Tenderness noted to palpation of bilateral anterior knees, more tender on the left than the right. Limited ROM due to pain. Some mild swelling noted on the left, none on the right. No obvious bruising, crepitus, or deformity noted to either knee. Positive anterior drawer on the left. DP and PT pulses present and 2+ bilaterally.   ?Skin: ?   General: Skin is warm and dry.  ?Neurological:  ?   General: No focal deficit present.  ?   Mental Status: She is alert.  ?Psychiatric:     ?   Mood and Affect: Mood normal.     ?   Behavior: Behavior normal.  ? ? ?ED Results / Procedures / Treatments   ?Labs ?(all labs ordered are listed, but only abnormal results are displayed) ?Labs Reviewed - No data to display ? ?EKG ?None ? ?Radiology ?DG Knee Complete 4 Views Left ? ?Result Date: 03/05/2022 ?CLINICAL DATA:  Bilateral anterior knee pain EXAM: LEFT KNEE - COMPLETE 4+ VIEW COMPARISON:  None. FINDINGS: No  joint effusion.  No degenerative change.  No traumatic finding. IMPRESSION: Normal radiographs. Electronically Signed   By: Nelson Chimes M.D.   On: 03/05/2022 10:32  ? ?DG Knee Complete 4 Views Right ? ?Result Date: 03/05/2022 ?CLINICAL DATA:  Bilateral knee pain EXAM: RIGHT KNEE - COMPLETE 4+ VIEW COMPARISON:  None. FINDINGS: No degenerative change.  No joint effusion.  No traumatic finding. IMPRESSION: Normal radiographs. Electronically Signed   By: Nelson Chimes M.D.   On: 03/05/2022 10:33   ? ?Procedures ?Procedures  ? ? ?Medications Ordered in ED ?Medications  ?acetaminophen (TYLENOL) tablet 650 mg (650 mg Oral Given 03/05/22 1035)  ?acetaminophen (TYLENOL) 325 MG tablet (  Given 03/05/22 1045)  ? ? ?ED  Course/ Medical Decision Making/ A&P ?  ?                        ?Medical Decision Making ?Amount and/or Complexity of Data Reviewed ?Radiology: ordered. ? ?Risk ?OTC drugs. ? ? ?Patient presents today with bilateral knee pain following knees buckling after stepping off a school bus earlier today. Patient X-Ray negative for obvious fracture or dislocation. Pain managed in ED. No swelling or erythema present, no concern for septic arthritis or other emergent concerns at this time.  Pt advised to follow up with orthopedics if symptoms persist for possibility of missed fracture diagnosis or ligamentous injury as she did have some swelling with positive anterior drawer test on the left. Patient also given brace while in ED, conservative therapy recommended and discussed. Patient observed to be ambulatory with intact gait, however given crutches at patients request. Patient will be dc home & is agreeable with above plan.  Educated on red flag symptoms of prompt immediate return, discharged in stable condition. ? ? ?Final Clinical Impression(s) / ED Diagnoses ?Final diagnoses:  ?Acute pain of both knees  ? ? ?Rx / DC Orders ?ED Discharge Orders   ? ? None  ? ?  ?An After Visit Summary was printed and given to the patient. ? ? ?  ?Bud Face, PA-C ?03/05/22 1116 ? ?  ?Lucrezia Starch, MD ?03/06/22 [id number] ? ?

## 2022-03-05 NOTE — ED Triage Notes (Signed)
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

## 2022-03-05 NOTE — Discharge Instructions (Addendum)
As we discussed, your work-up in the ER was reassuring for acute abnormalities.  Your x-ray was negative for fracture or dislocation.  I have given you a brace with crutches for your injury to use as needed and recommend rest, ice, compression, and elevation of your knees as you are able.  I have also given you a referral to to orthopedics with a number to call as needed if you continue to have pain.  I also recommend Tylenol and ibuprofen for pain and swelling. ? ?Return if development of any new or worsening symptoms. ?

## 2022-03-05 NOTE — ED Notes (Addendum)
Ortho tech paged for ortho device application. ?

## 2022-07-07 ENCOUNTER — Other Ambulatory Visit: Payer: Self-pay

## 2022-07-07 ENCOUNTER — Emergency Department (HOSPITAL_COMMUNITY)
Admission: EM | Admit: 2022-07-07 | Discharge: 2022-07-07 | Disposition: A | Discharge status: Home / Self Care | Payer: Medicaid Other | Attending: Emergency Medicine | Admitting: Emergency Medicine

## 2022-07-07 ENCOUNTER — Encounter (HOSPITAL_COMMUNITY): Payer: Self-pay

## 2022-07-07 ENCOUNTER — Emergency Department (HOSPITAL_COMMUNITY): Payer: Medicaid Other

## 2022-07-07 DIAGNOSIS — W228XXA Striking against or struck by other objects, initial encounter: Secondary | ICD-10-CM | POA: Diagnosis not present

## 2022-07-07 DIAGNOSIS — M25562 Pain in left knee: Secondary | ICD-10-CM | POA: Diagnosis not present

## 2022-07-07 DIAGNOSIS — Y9301 Activity, walking, marching and hiking: Secondary | ICD-10-CM | POA: Insufficient documentation

## 2022-07-07 MED ORDER — DICLOFENAC SODIUM 1 % EX GEL: 2.0000 g | Freq: Four times a day (QID) | CUTANEOUS | 0 refills | Status: DC

## 2022-07-07 MED ORDER — IBUPROFEN 800 MG PO TABS
800.0000 mg | ORAL_TABLET | Freq: Once | ORAL | Status: AC
  Administered 2022-07-07: 800 mg via ORAL
  Filled 2022-07-07: qty 1

## 2022-07-07 MED ORDER — IBUPROFEN 800 MG PO TABS: 800.0000 mg | ORAL_TABLET | Freq: Three times a day (TID) | ORAL | 0 refills | Status: DC

## 2022-07-07 NOTE — ED Provider Notes (Signed)
[facility] DEPT Provider Note   CSN: [id number] Arrival date & time: 07/07/22  1056     History  Chief Complaint  Patient presents with   Knee Pain   Ankle Pain    Kristen Williamson is a 46 y.o. female. With past medical history of pituitary tumor who presents to the emergency department with knee pain.  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  She states that yesterday she was walking on plane and hit her left knee on the armrest of a chair.  States that she had some mild swelling last night and pain to the medial aspect of the knee.  No previous knee surgeries.  She has been ambulatory.  She states that she has not tried anything for the pain.   Knee Pain Ankle Pain      Home Medications Prior to Admission medications   Medication Sig Start Date End Date Taking? Authorizing Provider  APPLE CIDER VINEGAR PO Take 2 tablets by mouth daily.    [provider]  HYDROcodone-acetaminophen (NORCO/VICODIN) 5-325 MG tablet Take 1 tablet by mouth every 4 (four) hours as needed for moderate pain. 05/05/21   Judith Part, MD  cetirizine (ZYRTEC ALLERGY) 10 MG tablet Take 1 tablet (10 mg total) by mouth daily. 07/08/20 03/12/21  Jaynee Eagles, PA-C      Allergies    Penicillins, Vancomycin, and Lortab [hydrocodone-acetaminophen]    Review of Systems   Review of Systems  Musculoskeletal:  Positive for arthralgias and joint swelling.  All other systems reviewed and are negative.   Physical Exam Updated Vital Signs BP 135/75 (BP Location: Left Arm)   Pulse 63   Temp 97.8 F (36.6 C) (Oral)   Resp 16   Ht '5\' 10"'$  (1.778 m)   Wt 70.3 kg   SpO2 100%   BMI 22.24 kg/m  Physical Exam Vitals and nursing note reviewed.  Constitutional:      General: She is not in acute distress.    Appearance: Normal appearance. She is not ill-appearing.  HENT:     Head:  Normocephalic and atraumatic.  Eyes:     General: No scleral icterus.    Extraocular Movements: Extraocular movements intact.  Cardiovascular:     Pulses: Normal pulses.  Pulmonary:     Effort: Pulmonary effort is normal. No respiratory distress.  Musculoskeletal:        General: Tenderness present. No swelling, deformity or signs of injury.     Right lower leg: No edema.     Left lower leg: No edema.     Comments: Left knee without swelling, erythema, warmth or joint effusion.  There is no bony crepitus.  She has mild decreased range of motion secondary to pain.  Tenderness to palpation of the medial joint line.  DP pulse 2+.  Compartments soft.  Neurovascularly intact.  Skin:    General: Skin is warm and dry.     Capillary Refill: Capillary refill takes less than 2 seconds.     Findings: No bruising or erythema.  Neurological:     General: No focal deficit present.     Mental Status: She is alert and oriented to person, place, and time. Mental status is at baseline.  Psychiatric:        Mood and Affect: Mood normal.        Behavior: Behavior normal.  Thought Content: Thought content normal.        Judgment: Judgment normal.     ED Results / Procedures / Treatments   Labs (all labs ordered are listed, but only abnormal results are displayed) Labs Reviewed - No data to display  EKG None  Radiology DG Knee Complete 4 Views Left  Result Date: 07/07/2022 CLINICAL DATA:  Struck RIGHT knee on an arm rest of an airplane chair yesterday, pain and swelling medial LEFT knee EXAM: LEFT KNEE - COMPLETE 4+ VIEW COMPARISON:  None FINDINGS: Osseous demineralization. Medial compartment joint space narrowing. No acute fracture, dislocation, or bone destruction. No joint effusion. IMPRESSION: Osseous demineralization with mild degenerative changes at medial compartment LEFT knee. No acute bony abnormalities. Electronically Signed   By: Lavonia Dana M.D.   On: 07/07/2022 12:42     Procedures Procedures   Medications Ordered in ED Medications  ibuprofen (ADVIL) tablet 800 mg (800 mg Oral Given 07/07/22 1414)    ED Course/ Medical Decision Making/ A&P                           Medical Decision Making Amount and/or Complexity of Data Reviewed Radiology: ordered.  Risk Prescription drug management.  This patient presents to the ED for concern of knee pain, this involves an extensive number of treatment options, and is a complaint that carries with it a high risk of complications and morbidity.  The differential diagnosis includes fracture, dislocation, ligamentous or tendonous injury, arthritis, septic joint, etc.   Co morbidities that complicate the patient evaluation none  Additional history obtained:  Additional history obtained from: none  External records from outside source obtained and reviewed including: most recent ED physician note  Imaging Studies ordered:  I ordered imaging studies which included x-ray.  I independently reviewed & interpreted imaging & am in agreement with radiology impression. Imaging shows:  Medications  I ordered medication including ibuprofen for pain Reevaluation of the patient after medication shows that patient improved -I reviewed the patient's home medications and did not make adjustments. -I did  prescribe new home medications.  Tests Considered: N/A  Critical Interventions: N/A  Consultations: N/A  SDH None identified   ED Course:  46 year old female who presents to the emergency department with left knee pain.  Physical exam is relatively unremarkable.  She does have some tenderness to palpation of the medial joint line.  Ibuprofen for pain X-ray of the left knee shows that she has osseous demineralization with mild degenerative changes in the medial compartment.  No fracture or dislocation Discussed findings with the patient.  This is likely arthritic changes or osteoarthritis changes to the knee.   There is no redness, erythema or warmth concerning for septic joint.  Doubt gouty arthritis or gonococcal arthritis.  No fracture or dislocations.  No significant effusion to tap.  We will provide her with a prescription for ibuprofen and Voltaren gel.  Also given a knee brace at her request.  Instructed to RICE.  She verbalized understanding.  Given return precautions for inability to bear weight.  After consideration of the diagnostic results and the patients response to treatment, I feel that the patent would benefit from discharge. The patient has been appropriately medically screened and/or stabilized in the ED. I have low suspicion for any other emergent medical condition which would require further screening, evaluation or treatment in the ED or require inpatient management. The patient is overall well appearing and non-toxic  in appearance. They are hemodynamically stable at time of discharge.   Final Clinical Impression(s) / ED Diagnoses Final diagnoses:  Acute pain of left knee    Rx / DC Orders ED Discharge Orders          Ordered    ibuprofen (ADVIL) 800 MG tablet  3 times daily        07/07/22 1515    diclofenac Sodium (VOLTAREN) 1 % GEL  4 times daily        07/07/22 1515              Mickie Hillier, PA-C 07/07/22 1515    Kneller, [place] K, DO 07/07/22 1655

## 2022-07-07 NOTE — ED Notes (Signed)
Ortho at bedside for application of knee brace

## 2022-07-07 NOTE — ED Triage Notes (Signed)
Patient c/o bilateral knee and ankle pain and swelling since yesterday. Patient states she has to climb a lot of stairs on her job.

## 2022-07-07 NOTE — ED Provider Triage Note (Signed)
Emergency Medicine Provider Triage Evaluation Note  Kristen Williamson , a 46 y.o. female  was evaluated in triage.  Pt complains of left knee pain.  Patient states that [place]  She states that yesterday she was walking onto the plane and hit her right knee on the armrest of a chair.  She states that since then she has had some pain in mild swelling to the medial aspect of her knee.  No previous knee surgery.  She has been ambulatory.  She has not tried anything for the pain..  Review of Systems  Positive:  Negative:   Physical Exam  BP 135/75 (BP Location: Left Arm)   Pulse 63   Temp 97.8 F (36.6 C) (Oral)   Resp 16   Ht '5\' 10"'$  (1.778 m)   Wt 70.3 kg   SpO2 100%   BMI 22.24 kg/m  Gen:   Awake, no distress   Resp:  Normal effort  MSK:   No obvious swelling or effusion to the left knee.  No tenderness or warmth.  She has limited range of motion secondary to pain. Other:    Medical Decision Making  Medically screening exam initiated at 12:17 PM.  Appropriate orders placed.  Alexsandra Shontz Verno was informed that the remainder of the evaluation will be completed by another provider, this initial triage assessment does not replace that evaluation, and the importance of remaining in the ED until their evaluation is complete.    Mickie Hillier, PA-C 07/07/22 1218

## 2022-07-07 NOTE — Discharge Instructions (Signed)
You were seen in the emergency department today for knee pain.  You likely have arthritis in your knee.  You may take ibuprofen every 8 hours for pain relief.  I am also providing you with gel to use for pain relief.  Please use the brace as needed for support.  Rest and elevate the leg when you are not working or otherwise walking around.  Please return if you are unable to bear weight.

## 2023-01-22 ENCOUNTER — Ambulatory Visit
Admission: EM | Admit: 2023-01-22 | Discharge: 2023-01-22 | Disposition: A | Discharge status: Home / Self Care | Payer: Self-pay | Attending: Physician Assistant | Admitting: Physician Assistant

## 2023-01-22 VITALS — BP 126/83 | HR 74 | Temp 98.2°F | Resp 18

## 2023-01-22 DIAGNOSIS — K047 Periapical abscess without sinus: Secondary | ICD-10-CM

## 2023-01-22 DIAGNOSIS — K029 Dental caries, unspecified: Secondary | ICD-10-CM

## 2023-01-22 MED ORDER — CLINDAMYCIN HCL 300 MG PO CAPS: 300.0000 mg | ORAL_CAPSULE | Freq: Three times a day (TID) | ORAL | 0 refills | Status: DC

## 2023-01-22 MED ORDER — IBUPROFEN 800 MG PO TABS: 800.0000 mg | ORAL_TABLET | Freq: Three times a day (TID) | ORAL | 0 refills | Status: DC

## 2023-01-22 NOTE — ED Triage Notes (Signed)
Pt present lefts side dental pain with swelling, symptoms started two days. Pt states her tooth is cracked.

## 2023-01-22 NOTE — ED Provider Notes (Signed)
[facility]    CSN: [id number] Arrival date & time: 01/22/23  0945      History   Chief Complaint Chief Complaint  Patient presents with   Dental Pain    HPI Kristen Williamson is a 47 y.o. female.   47 y/o female presents with tooth pain.  Patient indicates for the past several days she has been having left lower tooth pain, tenderness and discomfort.  She indicates that the tooth is broken.  She also indicates over the past several days she has had swelling along the lower left side of the jaw where the tooth is located.  She has not have any fever or chills.  She has not been able to take any OTC medicines to give relief from the pain and discomfort.  She does not have a dentist to contact for evaluation of the area.   Dental Pain   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   Shock ([facility]) 03/28/2020    Past Surgical History:  Procedure Laterality Date   ABDOMINAL HYSTERECTOMY     birth mark removed     CRANIOTOMY N/A 05/30/2020   Procedure: Transphenoidal Resection of Tumor;  Surgeon: Ashok Pall, MD;  Location: [facility];  Service: Neurosurgery;  Laterality: N/A;  Transphenoidal Resection of Tumor   CRANIOTOMY Right 05/03/2021   Procedure: Right Pterional craniotomy for tumor;  Surgeon: Ashok Pall, MD;  Location: [facility];  Service: Neurosurgery;  Laterality: Right;   TRANSPHENOIDAL APPROACH EXPOSURE N/A 05/30/2020   Procedure: TRANSPHENOIDAL APPROACH EXPOSURE;  Surgeon: Izora Gala, MD;  Location: [facility];  Service: ENT;  Laterality: N/A;  TRANSPHENOIDAL APPROACH EXPOSURE    OB History   No obstetric history on file.      Home Medications    Prior to Admission medications   Medication Sig Start Date End Date Taking? Authorizing Provider  clindamycin (CLEOCIN) 300 MG capsule Take 1 capsule (300 mg total) by mouth 3 (three) times daily. 01/22/23  Yes  Nyoka Lint, PA-C  APPLE CIDER VINEGAR PO Take 2 tablets by mouth daily.    [provider]  diclofenac Sodium (VOLTAREN) 1 % GEL Apply 2 g topically 4 (four) times daily. 07/07/22   Mickie Hillier, PA-C  HYDROcodone-acetaminophen (NORCO/VICODIN) 5-325 MG tablet Take 1 tablet by mouth every 4 (four) hours as needed for moderate pain. 05/05/21   Judith Part, MD  ibuprofen (ADVIL) 800 MG tablet Take 1 tablet (800 mg total) by mouth 3 (three) times daily. 01/22/23   Nyoka Lint, PA-C  cetirizine (ZYRTEC ALLERGY) 10 MG tablet Take 1 tablet (10 mg total) by mouth daily. 07/08/20 03/12/21  Jaynee Eagles, PA-C    Family History History reviewed. No pertinent family history.  Social History Social History   Tobacco Use   Smoking status: Former    Types: Cigarettes    Quit date: 2021    Years since quitting: 3.1   Smokeless tobacco: Never  Vaping Use   Vaping Use: Never used  Substance Use Topics   Alcohol use: Yes    Comment: occasional   Drug use: No     Allergies   Penicillins, Vancomycin, and Lortab [hydrocodone-acetaminophen]   Review of Systems Review of Systems  HENT:  Positive for dental problem (left lower molar broken).      Physical Exam Triage Vital Signs ED Triage Vitals  Enc Vitals Group     BP 01/22/23 1013 126/83     Pulse Rate 01/22/23 1013 74     Resp 01/22/23 1013 18     Temp 01/22/23 1013 98.2 F (36.8 C)     Temp Source 01/22/23 1013 Oral     SpO2 01/22/23 1013 97 %     Weight --      Height --      Head Circumference --      Peak Flow --      Pain Score 01/22/23 1014 5     Pain Loc --      Pain Edu? --      Excl. in [place]? --    No data found.  Updated Vital Signs BP 126/83 (BP Location: Right Arm)   Pulse 74   Temp 98.2 F (36.8 C) (Oral)   Resp 18   SpO2 97%   Visual Acuity Right Eye Distance:   Left Eye Distance:   Bilateral Distance:    Right Eye Near:   Left Eye Near:    Bilateral Near:     Physical  Exam Constitutional:      Appearance: Normal appearance.  HENT:     Right Ear: Tympanic membrane and ear canal normal.     Left Ear: Ear canal normal. Tympanic membrane is erythematous.     Mouth/Throat:     Mouth: Mucous membranes are moist.     Pharynx: Oropharynx is clear.      Comments: Mouth: Left lower first molar broken tooth, mild gum swelling present without drainage.  Swelling of the left lower jaw present exterior at the site of the broken tooth without any unusual redness externally. Neurological:     Mental Status: She is alert.      UC Treatments / Results  Labs (all labs ordered are listed, but only abnormal results are displayed) Labs Reviewed - No data to display  EKG   Radiology No results found.  Procedures Procedures (including critical care time)  Medications Ordered in UC Medications - No data to display  Initial Impression / Assessment and Plan / UC Course  I have reviewed the triage vital signs and the nursing notes.  Pertinent labs & imaging results that were available during my care of the patient were reviewed by me and considered in my medical decision making (see chart for details).    Plan: The diagnosis will be treated with the following: 1.  Dental abscess: A.  Clindamycin 300 mg every 8 hours to treat dental infection. B.  Warm compresses frequently to the area to reduce swelling and pain. 2.  Pain due to dental caries: A.  Ibuprofen 800 mg every 8 hours with food to help decrease pain and swelling. 3.  Patient given dental resources to contact dentist and arrange appointment to have the area evaluated. Final Clinical Impressions(s) / UC Diagnoses   Final diagnoses:  Pain due to dental caries  Dental abscess     Discharge Instructions      Advised take ibuprofen 800 mg every 8 hours with food to help relieve pain and discomfort. Advised to take clindamycin 300 mg every 8 hours until completed to treat dental abscess and  infection. Advised to apply warm compresses to the area frequently to help decrease pain.  Advised to review the dental resources and call dentist in order to have an appointment to have the area evaluated.  Advised to follow-up PCP or return to urgent care as  needed.    ED Prescriptions     Medication Sig Dispense Auth. Provider   ibuprofen (ADVIL) 800 MG tablet Take 1 tablet (800 mg total) by mouth 3 (three) times daily. 21 tablet Nyoka Lint, PA-C   clindamycin (CLEOCIN) 300 MG capsule Take 1 capsule (300 mg total) by mouth 3 (three) times daily. 30 capsule Nyoka Lint, PA-C      PDMP not reviewed this encounter.   Nyoka Lint, PA-C 01/22/23 1051

## 2023-01-22 NOTE — Discharge Instructions (Addendum)
Advised take ibuprofen 800 mg every 8 hours with food to help relieve pain and discomfort. Advised to take clindamycin 300 mg every 8 hours until completed to treat dental abscess and infection. Advised to apply warm compresses to the area frequently to help decrease pain.  Advised to review the dental resources and call dentist in order to have an appointment to have the area evaluated.  Advised to follow-up PCP or return to urgent care as needed.

## 2023-05-17 IMAGING — CR DG KNEE COMPLETE 4+V*L*
4 series · 4 of 4 positions shown · non-contrast
Comparison: None.

CLINICAL DATA: Bilateral anterior knee pain

EXAM:
LEFT KNEE - COMPLETE 4+ VIEW

[t knee ap left]
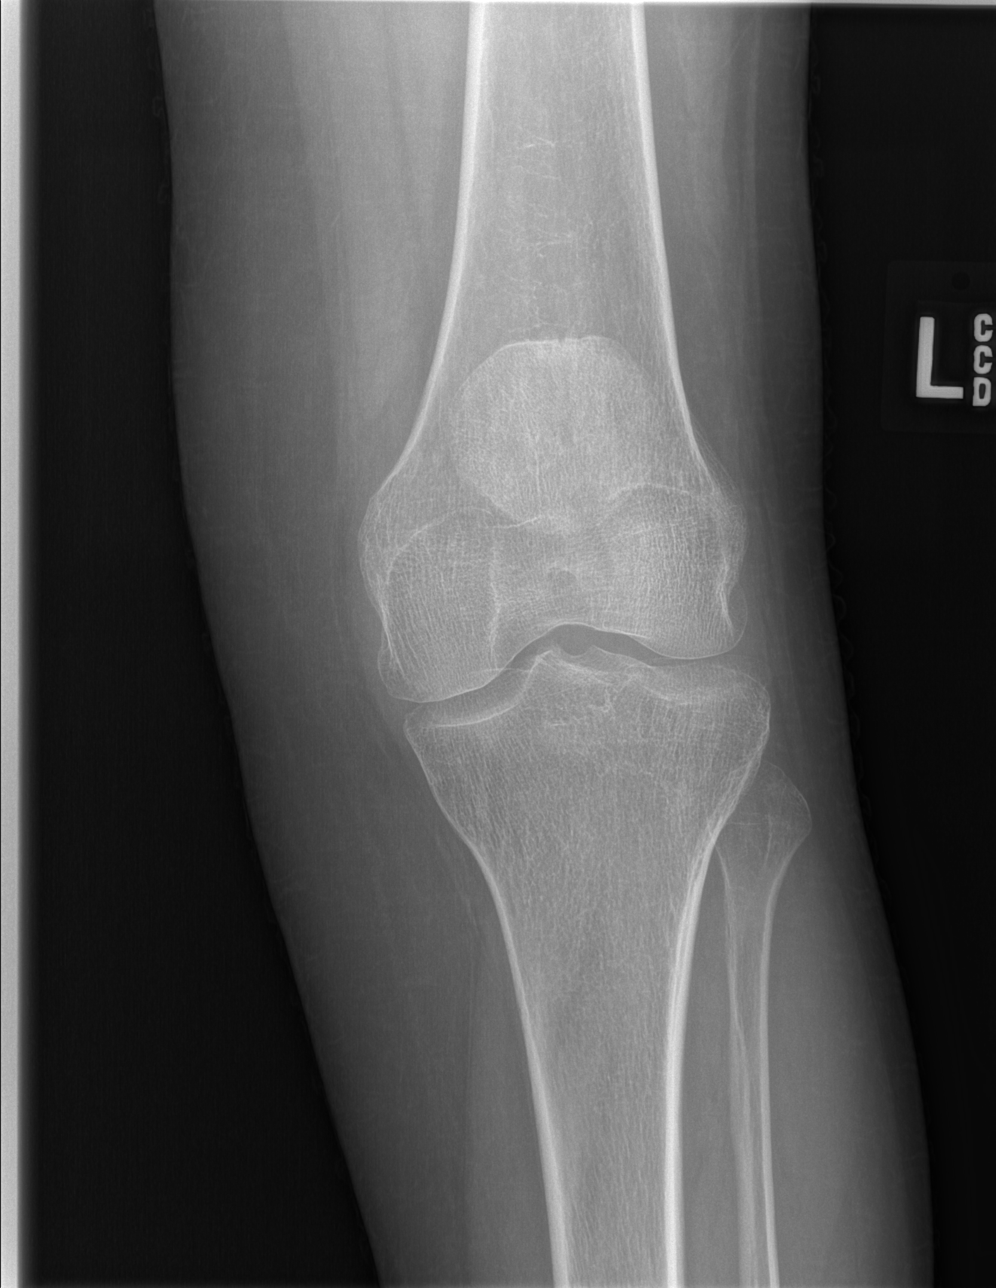

[t knee obl left (1 of 2)]
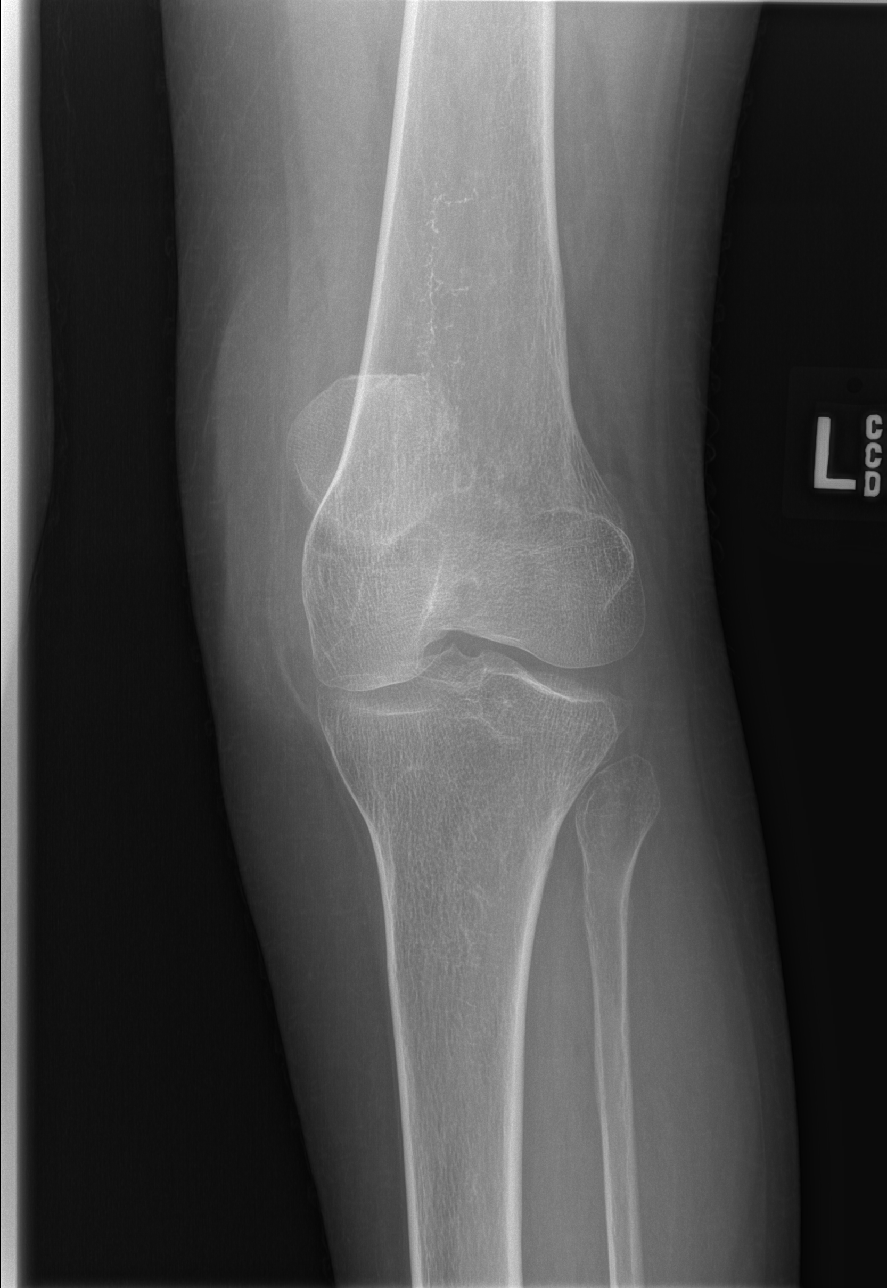

[t knee obl left (2 of 2)]
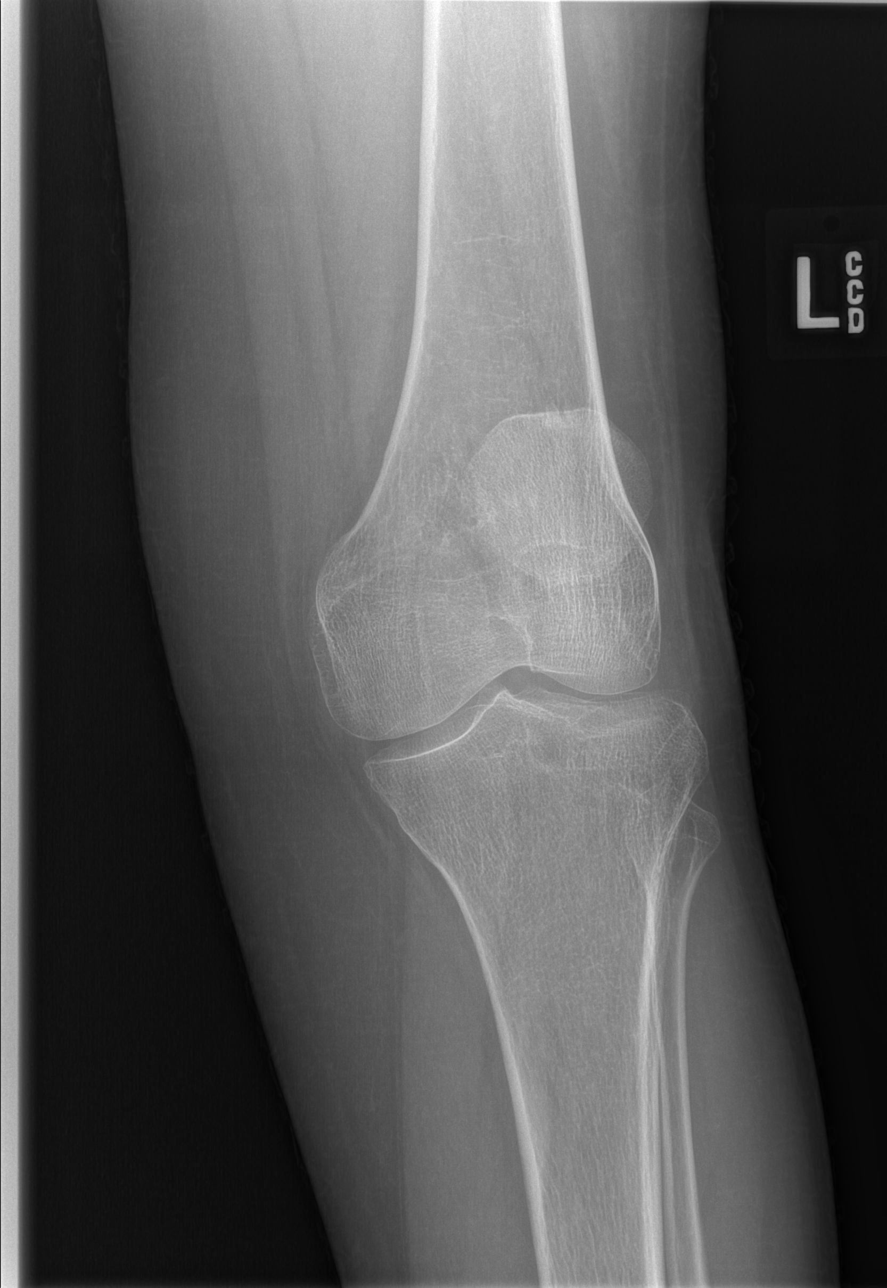

[t knee lat left]
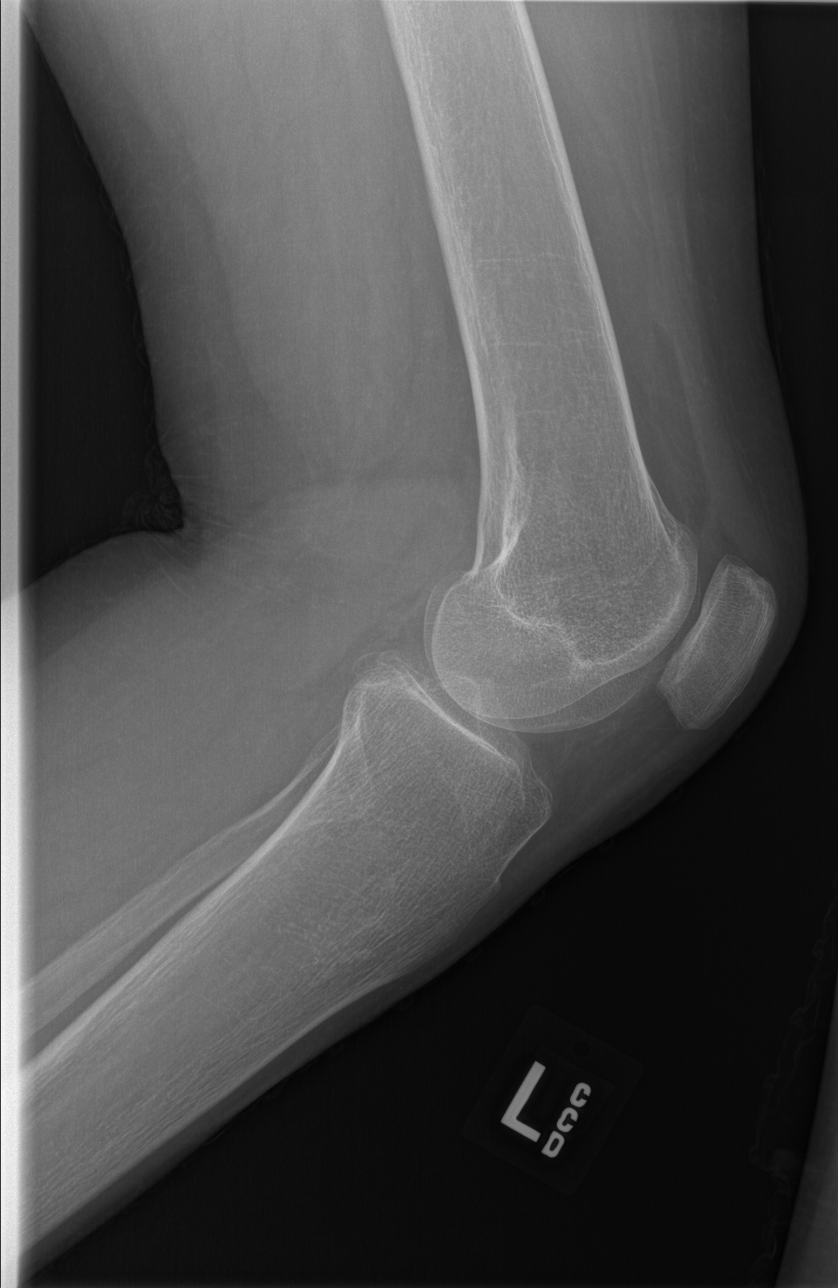

[4 of 4 positions shown; findings below may reference images not displayed]

FINDINGS: No joint effusion.  No degenerative change.  No traumatic finding.
IMPRESSION: Normal radiographs.

## 2023-05-17 IMAGING — CR DG KNEE COMPLETE 4+V*R*
4 series · 4 of 4 positions shown · non-contrast
Comparison: None.

CLINICAL DATA: Bilateral knee pain

EXAM:
RIGHT KNEE - COMPLETE 4+ VIEW

[t knee ap right]
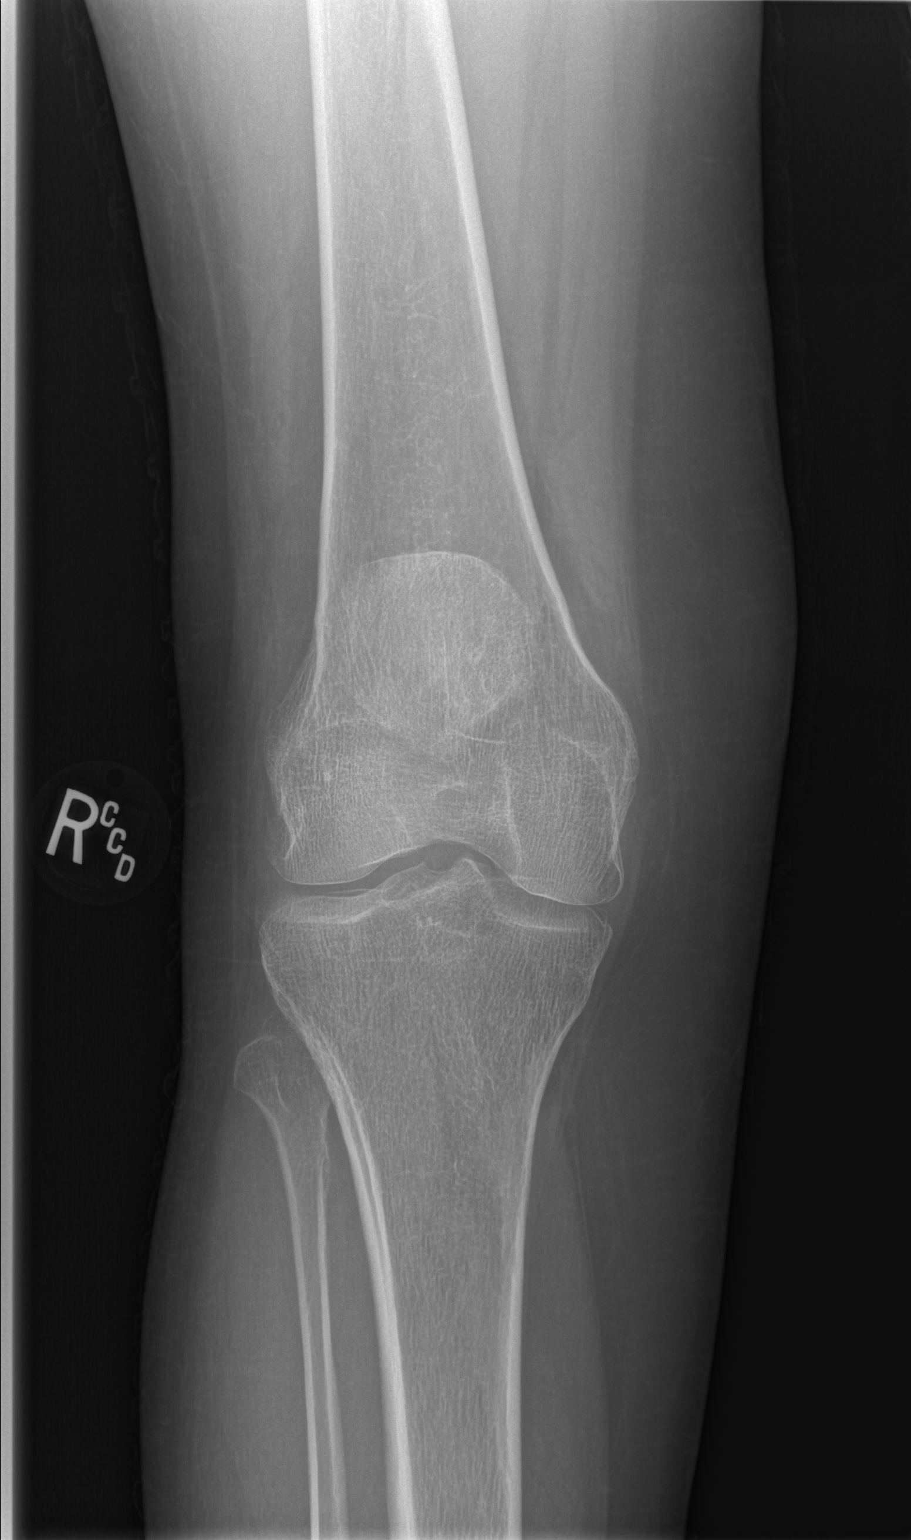

[t knee obl right (1 of 2)]
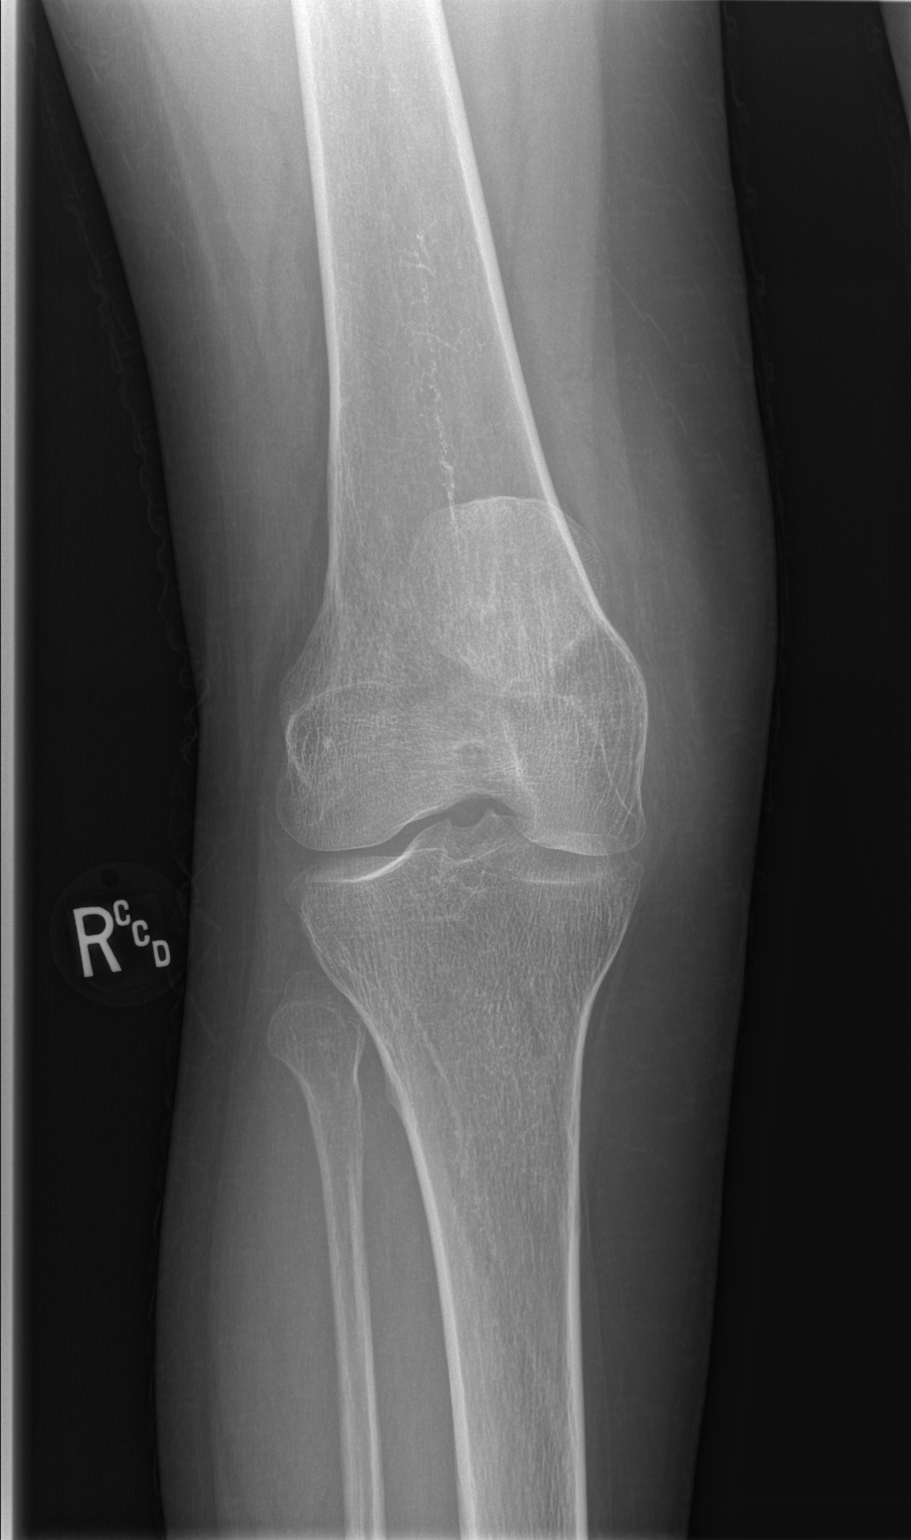

[t knee obl right (2 of 2)]
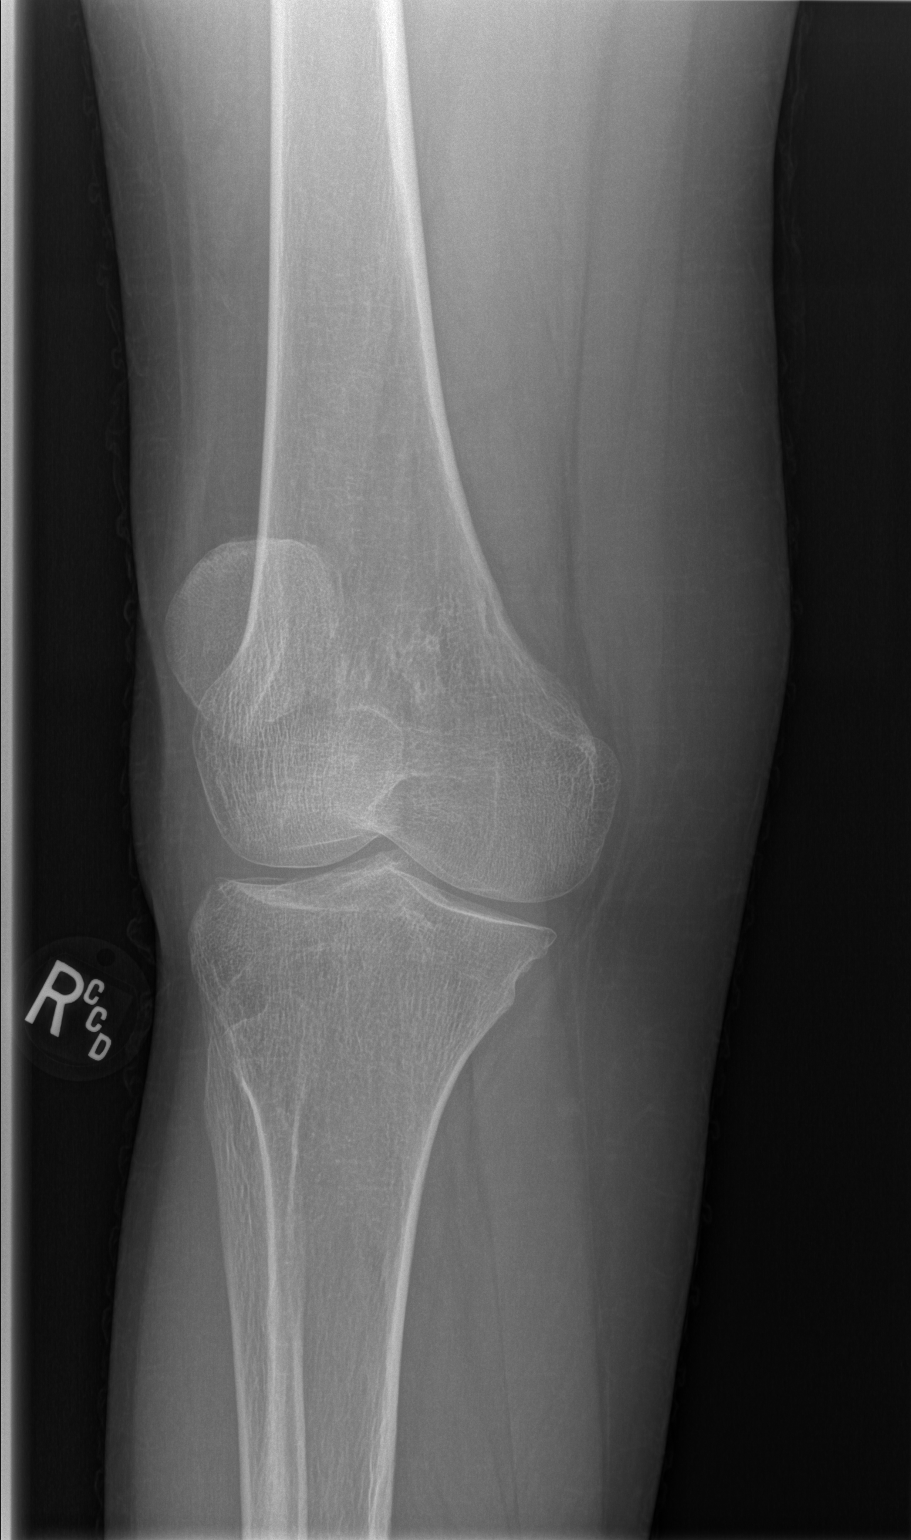

[t knee lat right]
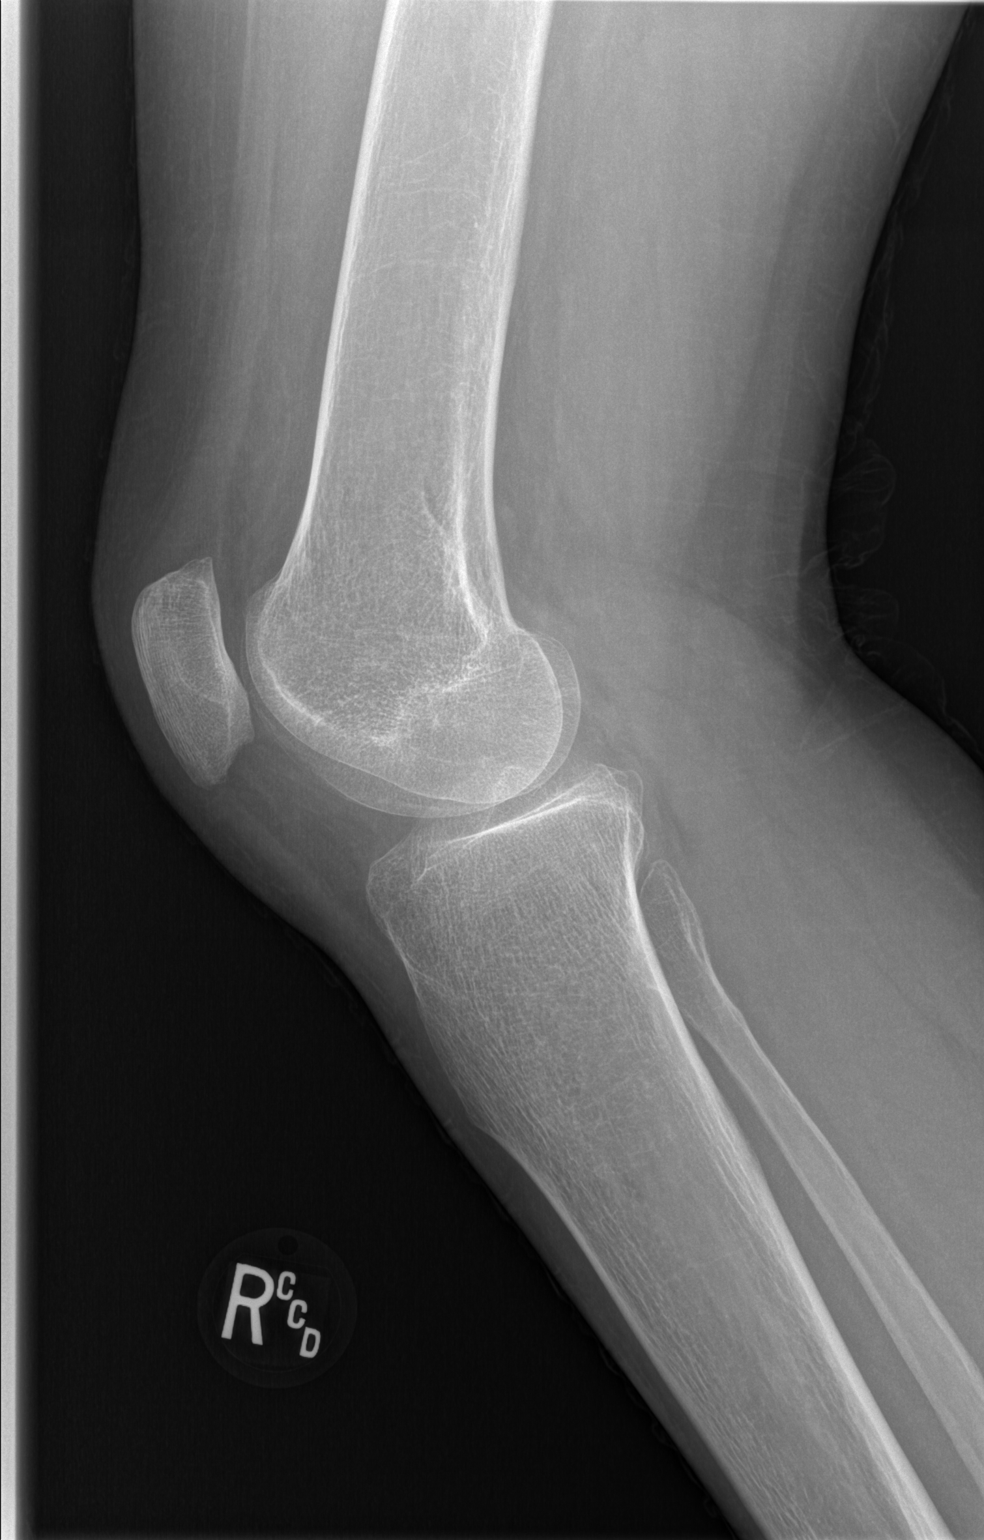

[4 of 4 positions shown; findings below may reference images not displayed]

FINDINGS: No degenerative change.  No joint effusion.  No traumatic finding.
IMPRESSION: Normal radiographs.

## 2023-07-08 ENCOUNTER — Ambulatory Visit
Admission: EM | Admit: 2023-07-08 | Discharge: 2023-07-08 | Disposition: A | Discharge status: Home / Self Care | Payer: Medicaid Other | Attending: Physician Assistant | Admitting: Physician Assistant

## 2023-07-08 DIAGNOSIS — J069 Acute upper respiratory infection, unspecified: Secondary | ICD-10-CM | POA: Diagnosis present

## 2023-07-08 DIAGNOSIS — Z20822 Contact with and (suspected) exposure to covid-19: Secondary | ICD-10-CM | POA: Insufficient documentation

## 2023-07-08 DIAGNOSIS — R509 Fever, unspecified: Secondary | ICD-10-CM | POA: Diagnosis present

## 2023-07-08 MED ORDER — ACETAMINOPHEN 325 MG PO TABS
975.0000 mg | ORAL_TABLET | Freq: Once | ORAL | Status: AC
  Administered 2023-07-08: 975 mg via ORAL

## 2023-07-08 NOTE — Discharge Instructions (Signed)
I believe you have a virus.  We will contact you if you are positive for COVID.  Please monitor your MyChart for these results.  Use Tylenol/ibuprofen for fever and pain.  Use over-the-counter medication such as Mucinex, Flonase, sinus rinses for additional symptom relief.  Ensure that you rest and drink plenty of fluid.  I recommend you obtain a pulse oximeter from the pharmacy.  These are generally $10-$20.  Monitor your oxygen saturation at home and if this drops below 93% you should be reevaluated and if below 90% you need to be seen immediately in the emergency room.  If your symptoms are not improving within a week please return for reevaluation.  If you have any chest pain, shortness of breath, nausea/vomiting interfering with oral intake, weakness, fever not responding to medication you need to be seen immediately.

## 2023-07-08 NOTE — ED Triage Notes (Signed)
Pt presents with c/o COVID exposure. States she has fatigue, sore throat and lightheaded ness.   Pt state she has not taken anything at home. Denies fever.

## 2023-07-08 NOTE — ED Provider Notes (Signed)
[facility]    CSN: [id number] Arrival date & time: 07/08/23  1324      History   Chief Complaint Chief Complaint  Patient presents with   Covid Exposure    HPI Sherial Dyas is a 47 y.o. female.   Patient presents today with a 2-day history of URI symptoms.  She reports some congestion, sore throat, fatigue, lightheadedness, headache, fever.  Denies any cough, nausea, vomiting, diarrhea, chest pain, shortness of breath.  She has not tried any over-the-counter medication for symptom management.  Does report that several members of her family have tested positive for COVID.  She has had COVID several years ago.  She had initial COVID-19 vaccines but has not had more recent booster.  She denies any history of asthma, COPD, smoking, diabetes, immunosuppression, active malignancy, chronic liver/kidney disease or cardiovascular disease.  She denies any recent antibiotics or steroids.  She is requesting a prescription for Paxlovid as this is what has been prescribed for her family members and they seem to have responded well to this.  She has no concern for pregnancy as she is status post hysterectomy.    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   Shock (HCC) 03/28/2020    Past Surgical History:  Procedure Laterality Date   ABDOMINAL HYSTERECTOMY     birth mark removed     CRANIOTOMY N/A 05/30/2020   Procedure: Transphenoidal Resection of Tumor;  Surgeon: Coletta Memos, MD;  Location: [facility] OR;  Service: Neurosurgery;  Laterality: N/A;  Transphenoidal Resection of Tumor   CRANIOTOMY Right 05/03/2021   Procedure: Right Pterional craniotomy for tumor;  Surgeon: Coletta Memos, MD;  Location: [facility] OR;  Service: Neurosurgery;  Laterality: Right;   TRANSPHENOIDAL APPROACH EXPOSURE N/A 05/30/2020   Procedure: TRANSPHENOIDAL APPROACH EXPOSURE;  Surgeon: Serena Colonel, MD;   Location: [facility] OR;  Service: ENT;  Laterality: N/A;  TRANSPHENOIDAL APPROACH EXPOSURE    OB History   No obstetric history on file.      Home Medications    Prior to Admission medications   Medication Sig Start Date End Date Taking? Authorizing Provider  APPLE CIDER VINEGAR PO Take 2 tablets by mouth daily.    [provider]  diclofenac Sodium (VOLTAREN) 1 % GEL Apply 2 g topically 4 (four) times daily. 07/07/22   Cristopher Peru, PA-C  cetirizine (ZYRTEC ALLERGY) 10 MG tablet Take 1 tablet (10 mg total) by mouth daily. 07/08/20 03/12/21  Wallis Bamberg, PA-C    Family History History reviewed. No pertinent family history.  Social History Social History   Tobacco Use   Smoking status: Former    Current packs/day: 0.00    Types: Cigarettes    Quit date: 2021    Years since quitting: 3.5   Smokeless tobacco: Never  Vaping Use   Vaping status: Never Used  Substance Use Topics   Alcohol use: Yes    Comment: occasional   Drug use: No     Allergies   Penicillins, Vancomycin, and Lortab [hydrocodone-acetaminophen]   Review of Systems Review of Systems  Constitutional:  Positive for activity change, fatigue and fever. Negative for appetite change.  HENT:  Positive for congestion and sore throat. Negative for sinus pressure and sneezing.   Respiratory:  Negative for cough and shortness of breath.   Cardiovascular:  Negative for chest pain.  Gastrointestinal:  Negative  for abdominal pain, diarrhea, nausea and vomiting.  Musculoskeletal:  Positive for arthralgias and myalgias.  Neurological:  Positive for headaches. Negative for dizziness and light-headedness.     Physical Exam Triage Vital Signs ED Triage Vitals  Encounter Vitals Group     BP 07/08/23 1343 118/78     Systolic BP Percentile --      Diastolic BP Percentile --      Pulse --      Resp 07/08/23 1341 18     Temp 07/08/23 1341 (!) 100.7 F (38.2 C)     Temp Source 07/08/23 1341 Oral     SpO2  07/08/23 1341 95 %     Weight --      Height --      Head Circumference --      Peak Flow --      Pain Score 07/08/23 1341 6     Pain Loc --      Pain Education --      Exclude from Growth Chart --    No data found.  Updated Vital Signs BP 118/78   Pulse 83   Temp 99.8 F (37.7 C) (Oral)   Resp 16   SpO2 95%   Visual Acuity Right Eye Distance:   Left Eye Distance:   Bilateral Distance:    Right Eye Near:   Left Eye Near:    Bilateral Near:     Physical Exam Vitals reviewed.  Constitutional:      General: She is awake. She is not in acute distress.    Appearance: Normal appearance. She is well-developed. She is not ill-appearing.     Comments: Very pleasant female appears stated age in no acute distress sitting comfortably in exam room  HENT:     Head: Normocephalic and atraumatic.     Right Ear: Tympanic membrane, ear canal and external ear normal. Tympanic membrane is not erythematous or bulging.     Left Ear: Tympanic membrane, ear canal and external ear normal. Tympanic membrane is not erythematous or bulging.     Nose:     Right Sinus: No maxillary sinus tenderness or frontal sinus tenderness.     Left Sinus: No maxillary sinus tenderness or frontal sinus tenderness.     Mouth/Throat:     Pharynx: Uvula midline. No oropharyngeal exudate or posterior oropharyngeal erythema.  Cardiovascular:     Rate and Rhythm: Normal rate and regular rhythm.     Heart sounds: Normal heart sounds, S1 normal and S2 normal. No murmur heard. Pulmonary:     Effort: Pulmonary effort is normal.     Breath sounds: Normal breath sounds. No wheezing, rhonchi or rales.     Comments: Clear to auscultation bilaterally Psychiatric:        Behavior: Behavior is cooperative.      UC Treatments / Results  Labs (all labs ordered are listed, but only abnormal results are displayed) Labs Reviewed  SARS CORONAVIRUS 2 (TAT 6-24 HRS)  BASIC METABOLIC PANEL    EKG   Radiology No  results found.  Procedures Procedures (including critical care time)  Medications Ordered in UC Medications  acetaminophen (TYLENOL) tablet 975 mg (975 mg Oral Given 07/08/23 1413)    Initial Impression / Assessment and Plan / UC Course  I have reviewed the triage vital signs and the nursing notes.  Pertinent labs & imaging results that were available during my care of the patient were reviewed by me and considered in my medical decision  making (see chart for details).     Patient was initially febrile but this improved to 99.8 F following a dose of antipyretics in clinic today.  No evidence of acute infection on physical exam that would want initiation of antibiotics.  She is otherwise well-appearing, nontoxic, nontachycardic.  Discussed symptoms are likely viral in nature.  She was tested for COVID and we discussed this is likely cause of her symptoms given known exposure.  She is young and otherwise healthy we discussed that she is unlikely to benefit significantly from Paxlovid but given her family has had a great response to this medication we will consider Paxlovid if she is positive for COVID.  We do not have a recent metabolic panel so we will obtain this to dose Paxlovid.  She was encouraged to use over-the-counter medications as needed.  Recommend that she obtain a pulse oximeter and monitor this at home.  If her oxygen saturation drops below 90% she is to go to the emergency room.  We discussed that if anything worsens or changes and she has worsening cough, shortness of breath, chest pain, nausea/vomiting interfering with oral intake she needs to be seen emergently.  Strict return precautions given.  Work excuse note provided.  Final Clinical Impressions(s) / UC Diagnoses   Final diagnoses:  Upper respiratory tract infection, unspecified type  Fever, unspecified  Close exposure to COVID-19 virus     Discharge Instructions      I believe you have a virus.  We will contact you  if you are positive for COVID.  Please monitor your MyChart for these results.  Use Tylenol/ibuprofen for fever and pain.  Use over-the-counter medication such as Mucinex, Flonase, sinus rinses for additional symptom relief.  Ensure that you rest and drink plenty of fluid.  I recommend you obtain a pulse oximeter from the pharmacy.  These are generally $10-$20.  Monitor your oxygen saturation at home and if this drops below 93% you should be reevaluated and if below 90% you need to be seen immediately in the emergency room.  If your symptoms are not improving within a week please return for reevaluation.  If you have any chest pain, shortness of breath, nausea/vomiting interfering with oral intake, weakness, fever not responding to medication you need to be seen immediately.      ED Prescriptions   None    PDMP not reviewed this encounter.   Jeani Hawking, PA-C 07/08/23 1517

## 2023-07-09 ENCOUNTER — Telehealth (HOSPITAL_COMMUNITY): Payer: Self-pay | Admitting: Emergency Medicine

## 2023-07-09 MED ORDER — NIRMATRELVIR/RITONAVIR (PAXLOVID)TABLET: 3.0000 | ORAL_TABLET | Freq: Two times a day (BID) | ORAL | 0 refills | Status: AC

## 2023-07-17 ENCOUNTER — Ambulatory Visit
Admission: EM | Admit: 2023-07-17 | Discharge: 2023-07-17 | Disposition: A | Discharge status: Home / Self Care | Payer: Medicaid Other | Attending: Internal Medicine | Admitting: Internal Medicine

## 2023-07-17 DIAGNOSIS — R5383 Other fatigue: Secondary | ICD-10-CM | POA: Diagnosis not present

## 2023-07-17 DIAGNOSIS — U071 COVID-19: Secondary | ICD-10-CM

## 2023-07-17 MED ORDER — PREDNISONE 20 MG PO TABS: 40.0000 mg | ORAL_TABLET | Freq: Every day | ORAL | 0 refills | Status: AC

## 2023-07-17 NOTE — Discharge Instructions (Signed)
Blood work is pending.  I have prescribed you prednisone to see if this will help alleviate your symptoms.  Follow-up if any symptoms persist or worsen.

## 2023-07-17 NOTE — ED Triage Notes (Signed)
Pt states she has Covid states she is not feeling any better and she was not able to get her prescription since her insurance did not cover it.  States she has been taking airborne at home.

## 2023-07-17 NOTE — ED Provider Notes (Signed)
[facility]    CSN: [id number] Arrival date & time: 07/17/23  1348      History   Chief Complaint Chief Complaint  Patient presents with   covid    HPI Kristen Williamson is a 47 y.o. female.   Patient presents today with concern of persistent COVID symptoms.  Patient tested positive for COVID-19 at previous visit on 07/08/2023.  She was prescribed Paxlovid but reports that her insurance do not cover it so she did not take this medication.  She recently had nasal congestion and cough which she was taking Airborne for.  States that her symptoms resolved and she felt much better until yesterday when she developed fatigue.  Denies any recent chest pain, shortness of breath, fever, cough.  Denies history of asthma or COPD and patient does not smoke cigarettes.     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   Shock (HCC) 03/28/2020    Past Surgical History:  Procedure Laterality Date   ABDOMINAL HYSTERECTOMY     birth mark removed     CRANIOTOMY N/A 05/30/2020   Procedure: Transphenoidal Resection of Tumor;  Surgeon: Coletta Memos, MD;  Location: [facility] OR;  Service: Neurosurgery;  Laterality: N/A;  Transphenoidal Resection of Tumor   CRANIOTOMY Right 05/03/2021   Procedure: Right Pterional craniotomy for tumor;  Surgeon: Coletta Memos, MD;  Location: [facility] OR;  Service: Neurosurgery;  Laterality: Right;   TRANSPHENOIDAL APPROACH EXPOSURE N/A 05/30/2020   Procedure: TRANSPHENOIDAL APPROACH EXPOSURE;  Surgeon: Serena Colonel, MD;  Location: [facility] OR;  Service: ENT;  Laterality: N/A;  TRANSPHENOIDAL APPROACH EXPOSURE    OB History   No obstetric history on file.      Home Medications    Prior to Admission medications   Medication Sig Start Date End Date Taking? Authorizing Provider  APPLE CIDER VINEGAR PO Take 2 tablets by mouth daily.    [provider]   diclofenac Sodium (VOLTAREN) 1 % GEL Apply 2 g topically 4 (four) times daily. 07/07/22   Cristopher Peru, PA-C  predniSONE (DELTASONE) 20 MG tablet Take 2 tablets (40 mg total) by mouth daily for 5 days. 07/17/23 07/22/23 Yes Delford Wingert, Acie Fredrickson, FNP  cetirizine (ZYRTEC ALLERGY) 10 MG tablet Take 1 tablet (10 mg total) by mouth daily. 07/08/20 03/12/21  Wallis Bamberg, PA-C    Family History History reviewed. No pertinent family history.  Social History Social History   Tobacco Use   Smoking status: Former    Current packs/day: 0.00    Types: Cigarettes    Quit date: 2021    Years since quitting: 3.6   Smokeless tobacco: Never  Vaping Use   Vaping status: Never Used  Substance Use Topics   Alcohol use: Yes    Comment: occasional   Drug use: No     Allergies   Penicillins, Vancomycin, and Lortab [hydrocodone-acetaminophen]   Review of Systems Review of Systems Per HPI  Physical Exam Triage Vital Signs ED Triage Vitals  Encounter Vitals Group     BP 07/17/23 1506 127/88     Systolic BP Percentile --      Diastolic BP Percentile --      Pulse Rate 07/17/23 1506 70     Resp 07/17/23 1506 16     Temp 07/17/23 1506 98.2 F (36.8 C)     Temp Source 07/17/23 1506 Oral  SpO2 07/17/23 1506 99 %     Weight --      Height --      Head Circumference --      Peak Flow --      Pain Score 07/17/23 1508 0     Pain Loc --      Pain Education --      Exclude from Growth Chart --    No data found.  Updated Vital Signs BP 127/88 (BP Location: Left Arm)   Pulse 70   Temp 98.2 F (36.8 C) (Oral)   Resp 16   SpO2 99%   Visual Acuity Right Eye Distance:   Left Eye Distance:   Bilateral Distance:    Right Eye Near:   Left Eye Near:    Bilateral Near:     Physical Exam Constitutional:      General: She is not in acute distress.    Appearance: Normal appearance. She is not toxic-appearing or diaphoretic.  HENT:     Head: Normocephalic and atraumatic.     Right Ear:  Tympanic membrane and ear canal normal.     Left Ear: Tympanic membrane and ear canal normal.     Nose: Nose normal.     Mouth/Throat:     Mouth: Mucous membranes are moist.     Pharynx: No posterior oropharyngeal erythema.  Eyes:     Extraocular Movements: Extraocular movements intact.     Conjunctiva/sclera: Conjunctivae normal.     Pupils: Pupils are equal, round, and reactive to light.  Cardiovascular:     Rate and Rhythm: Normal rate and regular rhythm.     Pulses: Normal pulses.     Heart sounds: Normal heart sounds.  Pulmonary:     Effort: Pulmonary effort is normal. No respiratory distress.     Breath sounds: Normal breath sounds. No stridor. No wheezing, rhonchi or rales.  Neurological:     General: No focal deficit present.     Mental Status: She is alert and oriented to person, place, and time. Mental status is at baseline.  Psychiatric:        Mood and Affect: Mood normal.        Behavior: Behavior normal.        Thought Content: Thought content normal.        Judgment: Judgment normal.      UC Treatments / Results  Labs (all labs ordered are listed, but only abnormal results are displayed) Labs Reviewed  CBC  BASIC METABOLIC PANEL    EKG   Radiology No results found.  Procedures Procedures (including critical care time)  Medications Ordered in UC Medications - No data to display  Initial Impression / Assessment and Plan / UC Course  I have reviewed the triage vital signs and the nursing notes.  Pertinent labs & imaging results that were available during my care of the patient were reviewed by me and considered in my medical decision making (see chart for details).     I suspect possible postviral COVID symptoms causing her fatigue.  There are no adventitious lung sounds on exam and vital signs are stable so do not think that chest imaging or emergent evaluation is necessary.  Will obtain BMP and CBC to rule out any worrisome etiology causing  patient's symptoms.  I do think patient would benefit from short course of steroid therapy to help alleviate symptoms.  Patient has a history of pituitary tumor removal in 2022 but reports that she is  no longer being treated for any type of cancer at this time.  Therefore, short course of prednisone should be safe.  Advised strict follow-up precautions if symptoms persist or worsen.  Patient verbalized understanding and was agreeable with plan. Final Clinical Impressions(s) / UC Diagnoses   Final diagnoses:  COVID-19  Other fatigue     Discharge Instructions      Blood work is pending.  I have prescribed you prednisone to see if this will help alleviate your symptoms.  Follow-up if any symptoms persist or worsen.    ED Prescriptions     Medication Sig Dispense Auth. Provider   predniSONE (DELTASONE) 20 MG tablet Take 2 tablets (40 mg total) by mouth daily for 5 days. 10 tablet Gustavus Bryant, [place]      PDMP not reviewed this encounter.   Gustavus Bryant, [place] 07/17/23 1525

## 2023-07-30 ENCOUNTER — Ambulatory Visit
Admission: EM | Admit: 2023-07-30 | Discharge: 2023-07-30 | Disposition: A | Discharge status: Home / Self Care | Payer: Medicaid Other | Attending: Physician Assistant | Admitting: Physician Assistant

## 2023-07-30 DIAGNOSIS — M79671 Pain in right foot: Secondary | ICD-10-CM

## 2023-07-30 MED ORDER — PREDNISONE 20 MG PO TABS: 40.0000 mg | ORAL_TABLET | Freq: Every day | ORAL | 0 refills | Status: AC

## 2023-07-30 NOTE — ED Provider Notes (Signed)
[facility]    CSN: [id number] Arrival date & time: 07/30/23  1317      History   Chief Complaint Chief Complaint  Patient presents with   Foot Pain    Right    HPI Kristen Williamson is a 47 y.o. female.   Patient here today for evaluation of right foot and ankle pain and swelling.  She denies any injury.  She states symptoms have been ongoing for the last month.  Walking makes pain worse.  She denies any numbness or tingling.  The history is provided by the patient.  Foot Pain Pertinent negatives include no shortness of breath.    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   Shock (HCC) 03/28/2020    Past Surgical History:  Procedure Laterality Date   ABDOMINAL HYSTERECTOMY     birth mark removed     CRANIOTOMY N/A 05/30/2020   Procedure: Transphenoidal Resection of Tumor;  Surgeon: Coletta Memos, MD;  Location: [facility] OR;  Service: Neurosurgery;  Laterality: N/A;  Transphenoidal Resection of Tumor   CRANIOTOMY Right 05/03/2021   Procedure: Right Pterional craniotomy for tumor;  Surgeon: Coletta Memos, MD;  Location: [facility] OR;  Service: Neurosurgery;  Laterality: Right;   TRANSPHENOIDAL APPROACH EXPOSURE N/A 05/30/2020   Procedure: TRANSPHENOIDAL APPROACH EXPOSURE;  Surgeon: Serena Colonel, MD;  Location: [facility] OR;  Service: ENT;  Laterality: N/A;  TRANSPHENOIDAL APPROACH EXPOSURE    OB History   No obstetric history on file.      Home Medications    Prior to Admission medications   Medication Sig Start Date End Date Taking? Authorizing Provider  predniSONE (DELTASONE) 20 MG tablet Take 2 tablets (40 mg total) by mouth daily with breakfast for 5 days. 07/30/23 08/04/23 Yes Tomi Bamberger, PA-C  APPLE CIDER VINEGAR PO Take 2 tablets by mouth daily.    [provider]  diclofenac Sodium (VOLTAREN) 1 % GEL Apply 2 g topically 4 (four) times daily.  07/07/22   Cristopher Peru, PA-C  cetirizine (ZYRTEC ALLERGY) 10 MG tablet Take 1 tablet (10 mg total) by mouth daily. 07/08/20 03/12/21  Wallis Bamberg, PA-C    Family History History reviewed. No pertinent family history.  Social History Social History   Tobacco Use   Smoking status: Former    Current packs/day: 0.00    Types: Cigarettes    Quit date: 2021    Years since quitting: 3.6   Smokeless tobacco: Never  Vaping Use   Vaping status: Never Used  Substance Use Topics   Alcohol use: Yes    Comment: occasional   Drug use: No     Allergies   Penicillins, Vancomycin, Acetaminophen, Hydrocodone, and Hydrocodone-acetaminophen   Review of Systems Review of Systems  Constitutional:  Negative for chills and fever.  Eyes:  Negative for discharge and redness.  Respiratory:  Negative for shortness of breath.   Gastrointestinal:  Negative for nausea and vomiting.  Musculoskeletal:  Positive for arthralgias and joint swelling.  Neurological:  Negative for numbness.     Physical Exam Triage Vital Signs ED Triage Vitals  Encounter Vitals Group     BP 07/30/23 1336 119/70     Systolic BP Percentile --      Diastolic BP Percentile --      Pulse Rate 07/30/23 1336 71     Resp 07/30/23 1336 18  Temp 07/30/23 1336 98.6 F (37 C)     Temp Source 07/30/23 1336 Oral     SpO2 07/30/23 1336 98 %     Weight 07/30/23 1334 175 lb (79.4 kg)     Height 07/30/23 1334 5\' 10"  (1.778 m)     Head Circumference --      Peak Flow --      Pain Score 07/30/23 1331 10     Pain Loc --      Pain Education --      Exclude from Growth Chart --    No data found.  Updated Vital Signs BP 119/70 (BP Location: Left Arm)   Pulse 71   Temp 98.6 F (37 C) (Oral)   Resp 18   Ht 5\' 10"  (1.778 m)   Wt 175 lb (79.4 kg)   SpO2 98%   BMI 25.11 kg/m      Physical Exam Vitals and nursing note reviewed.  Constitutional:      General: She is not in acute distress.    Appearance: Normal  appearance. She is not ill-appearing.  HENT:     Head: Normocephalic and atraumatic.  Eyes:     Conjunctiva/sclera: Conjunctivae normal.  Cardiovascular:     Rate and Rhythm: Normal rate.  Pulmonary:     Effort: Pulmonary effort is normal. No respiratory distress.  Musculoskeletal:     Comments: Mild diffuse swelling to right ankle and foot.  No erythema.  Skin:    Capillary Refill: Normal cap refill to right toes Neurological:     Mental Status: She is alert.     Comments: Gross sensation intact to right toes  Psychiatric:        Mood and Affect: Mood normal.        Behavior: Behavior normal.        Thought Content: Thought content normal.      UC Treatments / Results  Labs (all labs ordered are listed, but only abnormal results are displayed) Labs Reviewed - No data to display  EKG   Radiology No results found.  Procedures Procedures (including critical care time)  Medications Ordered in UC Medications - No data to display  Initial Impression / Assessment and Plan / UC Course  I have reviewed the triage vital signs and the nursing notes.  Pertinent labs & imaging results that were available during my care of the patient were reviewed by me and considered in my medical decision making (see chart for details).    Unclear etiology of swelling.  Will treat with steroid burst to cover inflammatory response and recommended follow-up with Ortho should symptoms not improve or worsen in any way.  Patient expressed understanding.  Final Clinical Impressions(s) / UC Diagnoses   Final diagnoses:  Right foot pain   Discharge Instructions   None    ED Prescriptions     Medication Sig Dispense Auth. Provider   predniSONE (DELTASONE) 20 MG tablet Take 2 tablets (40 mg total) by mouth daily with breakfast for 5 days. 10 tablet Tomi Bamberger, PA-C      PDMP not reviewed this encounter.   Tomi Bamberger, PA-C 07/30/23 1640

## 2023-07-30 NOTE — ED Triage Notes (Signed)
"  My right foot/ankle is swollen and hurts". "No recent injury, I did hurt my ankle some time ago".

## 2023-09-02 ENCOUNTER — Other Ambulatory Visit: Payer: Self-pay

## 2023-09-02 ENCOUNTER — Other Ambulatory Visit (HOSPITAL_BASED_OUTPATIENT_CLINIC_OR_DEPARTMENT_OTHER): Payer: Self-pay

## 2023-09-02 ENCOUNTER — Emergency Department (HOSPITAL_BASED_OUTPATIENT_CLINIC_OR_DEPARTMENT_OTHER)
Admission: EM | Admit: 2023-09-02 | Discharge: 2023-09-02 | Disposition: A | Discharge status: Home / Self Care | Payer: Medicaid Other | Attending: Emergency Medicine | Admitting: Emergency Medicine

## 2023-09-02 ENCOUNTER — Encounter (HOSPITAL_BASED_OUTPATIENT_CLINIC_OR_DEPARTMENT_OTHER): Payer: Self-pay | Admitting: Emergency Medicine

## 2023-09-02 ENCOUNTER — Emergency Department (HOSPITAL_BASED_OUTPATIENT_CLINIC_OR_DEPARTMENT_OTHER): Payer: Medicaid Other | Admitting: Radiology

## 2023-09-02 DIAGNOSIS — M79671 Pain in right foot: Secondary | ICD-10-CM | POA: Diagnosis present

## 2023-09-02 MED ORDER — ETODOLAC 400 MG PO TABS: 400.0000 mg | ORAL_TABLET | Freq: Two times a day (BID) | ORAL | 0 refills | Status: DC

## 2023-09-02 NOTE — ED Provider Notes (Signed)
[place] EMERGENCY DEPARTMENT AT [facility] Provider Note   CSN: [id number] Arrival date & time: 09/02/23  1128     History  Chief Complaint  Patient presents with   Foot Pain    Kristen Williamson is a 47 y.o. female.  47 year old female presents today for concern of right ankle/foot pain that started a couple months ago but recently worsened.  Pain is worse during weightbearing.  She states this started after she got a cortisone injection in her knee.  She has chronic knee pain.  This was done in [place].  She denies any direct injury to her foot.  No fever, or swelling.  She has taken Motrin which helps.  The history is provided by the patient. No language interpreter was used.       Home Medications Prior to Admission medications   Medication Sig Start Date End Date Taking? Authorizing Provider  APPLE CIDER VINEGAR PO Take 2 tablets by mouth daily.    [provider]  diclofenac Sodium (VOLTAREN) 1 % GEL Apply 2 g topically 4 (four) times daily. 07/07/22   Cristopher Peru, PA-C  cetirizine (ZYRTEC ALLERGY) 10 MG tablet Take 1 tablet (10 mg total) by mouth daily. 07/08/20 03/12/21  Wallis Bamberg, PA-C      Allergies    Penicillins, Vancomycin, Acetaminophen, Hydrocodone, and Hydrocodone-acetaminophen    Review of Systems   Review of Systems  Constitutional:  Negative for chills and fever.  Musculoskeletal:  Positive for arthralgias.  All other systems reviewed and are negative.   Physical Exam Updated Vital Signs BP 137/88 (BP Location: Right Arm)   Pulse 67   Temp 99.6 F (37.6 C) (Oral)   Resp 18   SpO2 98%  Physical Exam Vitals and nursing note reviewed.  Constitutional:      General: She is not in acute distress.    Appearance: Normal appearance. She is not ill-appearing.  HENT:     Head: Normocephalic and atraumatic.     Nose: Nose normal.  Eyes:     Conjunctiva/sclera: Conjunctivae normal.  Pulmonary:     Effort: Pulmonary effort  is normal. No respiratory distress.  Musculoskeletal:        General: No deformity. Normal range of motion.     Comments: No significant tenderness to palpation of the right ankle or forefoot.  Neurovascularly intact.  Good strength bilateral ankles.  Right knee without tenderness to palpation.  Skin:    Findings: No rash.  Neurological:     Mental Status: She is alert.     ED Results / Procedures / Treatments   Labs (all labs ordered are listed, but only abnormal results are displayed) Labs Reviewed - No data to display  EKG None  Radiology No results found.  Procedures Procedures    Medications Ordered in ED Medications - No data to display  ED Course/ Medical Decision Making/ A&P                                 Medical Decision Making Amount and/or Complexity of Data Reviewed Radiology: ordered.   47 year old female presents today for concern of right ankle/foot pain that started a couple months ago.  Neurovascularly intact.  Good range of motion of the right ankle.  Able ambulate without significant difficulty.  X-ray obtained.  No acute bony abnormality.  Will trial anti-inflammatory and follow-up with PCP.  She is agreeable.  Discharged  in stable condition.  Final Clinical Impression(s) / ED Diagnoses Final diagnoses:  Right foot pain    Rx / DC Orders ED Discharge Orders          Ordered    etodolac (LODINE) 400 MG tablet  2 times daily        09/02/23 1522              Marita Kansas, PA-C 09/02/23 1523    Zadie Rhine, MD 09/05/23 [phone]

## 2023-09-02 NOTE — ED Triage Notes (Signed)
Right ankle/foot pain Started "months ago" Swelling and pain. Reports symptoms started after receiving a steroid stot in right knee. Ibuprofen helps some.

## 2023-09-02 NOTE — Discharge Instructions (Signed)
X-ray does not show any concerns.  We will trial anti-inflammatory medication to see if this helps with your symptoms.  Follow-up with your primary care provider.  If any concerning symptoms return to the emergency room.

## 2023-12-18 ENCOUNTER — Ambulatory Visit
Admission: EM | Admit: 2023-12-18 | Discharge: 2023-12-18 | Disposition: A | Discharge status: Home / Self Care | Payer: Medicaid Other | Attending: Family | Admitting: Family

## 2023-12-18 DIAGNOSIS — R519 Headache, unspecified: Secondary | ICD-10-CM

## 2023-12-18 DIAGNOSIS — M25562 Pain in left knee: Secondary | ICD-10-CM | POA: Diagnosis not present

## 2023-12-18 DIAGNOSIS — R220 Localized swelling, mass and lump, head: Secondary | ICD-10-CM | POA: Diagnosis not present

## 2023-12-18 DIAGNOSIS — K05219 Aggressive periodontitis, localized, unspecified severity: Secondary | ICD-10-CM | POA: Diagnosis not present

## 2023-12-18 DIAGNOSIS — G8929 Other chronic pain: Secondary | ICD-10-CM

## 2023-12-18 MED ORDER — CLINDAMYCIN HCL 300 MG PO CAPS: 300.0000 mg | ORAL_CAPSULE | Freq: Three times a day (TID) | ORAL | 0 refills | Status: AC
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
## 2023-12-18 NOTE — ED Triage Notes (Signed)
 Patient presents with abscess on lower bottom on mouth x 3 days. Treated with Tylenol.

## 2023-12-18 NOTE — Discharge Instructions (Signed)
 Recommend start Clindamycin  300mg  3 times a day with food for 7 days. Continue to massage right lower jaw and may alternate cool compresses and warm compresses to the area as needed for comfort. Recommend follow-up with a dentist or ENT for further evaluation.  Also recommend you contact [facility] for further evaluation of continued left knee pain.

## 2024-02-02 ENCOUNTER — Other Ambulatory Visit: Payer: Self-pay

## 2024-02-02 DIAGNOSIS — X101XXA Contact with hot food, initial encounter: Secondary | ICD-10-CM | POA: Diagnosis not present

## 2024-02-02 DIAGNOSIS — T24232A Burn of second degree of left lower leg, initial encounter: Secondary | ICD-10-CM | POA: Insufficient documentation

## 2024-02-02 DIAGNOSIS — Z23 Encounter for immunization: Secondary | ICD-10-CM | POA: Diagnosis not present
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
## 2024-02-03 ENCOUNTER — Emergency Department (HOSPITAL_BASED_OUTPATIENT_CLINIC_OR_DEPARTMENT_OTHER)
Admission: EM | Admit: 2024-02-03 | Discharge: 2024-02-03 | Disposition: A | Discharge status: Home / Self Care | Payer: Medicaid Other | Attending: Emergency Medicine | Admitting: Emergency Medicine

## 2024-02-03 DIAGNOSIS — T24232A Burn of second degree of left lower leg, initial encounter: Secondary | ICD-10-CM | POA: Diagnosis not present

## 2024-02-03 DIAGNOSIS — T24202A Burn of second degree of unspecified site of left lower limb, except ankle and foot, initial encounter: Secondary | ICD-10-CM

## 2024-02-03 MED ORDER — IBUPROFEN 400 MG PO TABS
400.0000 mg | ORAL_TABLET | Freq: Once | ORAL | Status: AC
  Administered 2024-02-03: 400 mg via ORAL
  Filled 2024-02-03: qty 1

## 2024-02-03 MED ORDER — TETANUS-DIPHTH-ACELL PERTUSSIS 5-2.5-18.5 LF-MCG/0.5 IM SUSY
0.5000 mL | PREFILLED_SYRINGE | Freq: Once | INTRAMUSCULAR | Status: AC
  Administered 2024-02-03: 0.5 mL via INTRAMUSCULAR
  Filled 2024-02-03: qty 0.5

## 2024-02-03 MED ORDER — SILVER SULFADIAZINE 1 % EX CREA
TOPICAL_CREAM | Freq: Once | CUTANEOUS | Status: AC
  Administered 2024-02-03: 1 via TOPICAL
  Filled 2024-02-03: qty 85
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
## 2024-02-15 ENCOUNTER — Encounter (HOSPITAL_BASED_OUTPATIENT_CLINIC_OR_DEPARTMENT_OTHER): Payer: Self-pay

## 2024-02-15 ENCOUNTER — Emergency Department (HOSPITAL_BASED_OUTPATIENT_CLINIC_OR_DEPARTMENT_OTHER)
Admission: EM | Admit: 2024-02-15 | Discharge: 2024-02-15 | Disposition: A | Discharge status: Home / Self Care | Attending: Emergency Medicine | Admitting: Emergency Medicine

## 2024-02-15 ENCOUNTER — Other Ambulatory Visit: Payer: Self-pay

## 2024-02-15 DIAGNOSIS — T24232S Burn of second degree of left lower leg, sequela: Secondary | ICD-10-CM | POA: Diagnosis not present

## 2024-02-15 DIAGNOSIS — M79605 Pain in left leg: Secondary | ICD-10-CM | POA: Diagnosis present

## 2024-02-15 DIAGNOSIS — T24232D Burn of second degree of left lower leg, subsequent encounter: Secondary | ICD-10-CM | POA: Insufficient documentation

## 2024-02-15 DIAGNOSIS — T24202S Burn of second degree of unspecified site of left lower limb, except ankle and foot, sequela: Secondary | ICD-10-CM

## 2024-02-15 DIAGNOSIS — X088XXD Exposure to other specified smoke, fire and flames, subsequent encounter: Secondary | ICD-10-CM | POA: Insufficient documentation

## 2024-02-15 DIAGNOSIS — X088XXS Exposure to other specified smoke, fire and flames, sequela: Secondary | ICD-10-CM | POA: Diagnosis not present

## 2024-02-15 MED ORDER — DOXYCYCLINE HYCLATE 100 MG PO CAPS: 100.0000 mg | ORAL_CAPSULE | Freq: Two times a day (BID) | ORAL | 0 refills | Status: AC

## 2024-02-15 MED ORDER — ACETAMINOPHEN 325 MG PO TABS
650.0000 mg | ORAL_TABLET | Freq: Once | ORAL | Status: AC
  Administered 2024-02-15: 650 mg via ORAL
  Filled 2024-02-15: qty 2

## 2024-02-15 MED ORDER — HYDROCODONE-ACETAMINOPHEN 5-325 MG PO TABS: 1.0000 | ORAL_TABLET | Freq: Four times a day (QID) | ORAL | 0 refills | Status: AC | PRN

## 2024-02-15 MED ORDER — HYDROCODONE-ACETAMINOPHEN 5-325 MG PO TABS
1.0000 | ORAL_TABLET | Freq: Once | ORAL | Status: AC
  Administered 2024-02-15: 1 via ORAL
  Filled 2024-02-15: qty 1
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
## 2024-02-15 NOTE — ED Notes (Signed)
 Crackers given with vicodin to prevent nausea.
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
## 2024-02-15 NOTE — ED Notes (Signed)
 Extra wound supplies sent with pt. Instructions provided per md
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
## 2024-11-16 ENCOUNTER — Other Ambulatory Visit: Payer: Self-pay | Admitting: Neurosurgery

## 2024-12-16 ENCOUNTER — Ambulatory Visit: Admit: 2024-12-16 | Admitting: Neurosurgery

## 2024-12-16 SURGERY — CRANIOTOMY HYPOPHYSECTOMY TRANSNASAL APPROACH
Anesthesia: General

## 2025-02-10 ENCOUNTER — Ambulatory Visit (HOSPITAL_COMMUNITY): Admission: RE | Admit: 2025-02-10 | Source: Home / Self Care | Admitting: Neurosurgery

## 2025-02-10 ENCOUNTER — Encounter: Admission: RE | Payer: Self-pay | Source: Home / Self Care

## 2025-02-10 SURGERY — CRANIOTOMY HYPOPHYSECTOMY TRANSNASAL APPROACH
Anesthesia: General
